# Patient Record
Sex: Male | Born: 1940
Health system: Southern US, Community
[De-identification: ages and names within clinical notes are randomized; demographics above are authoritative.]

## PROBLEM LIST (undated history)

## (undated) DIAGNOSIS — Z8619 Personal history of other infectious and parasitic diseases: Secondary | ICD-10-CM

## (undated) DIAGNOSIS — H409 Unspecified glaucoma: Secondary | ICD-10-CM

## (undated) DIAGNOSIS — N529 Male erectile dysfunction, unspecified: Secondary | ICD-10-CM

## (undated) DIAGNOSIS — R011 Cardiac murmur, unspecified: Secondary | ICD-10-CM

## (undated) DIAGNOSIS — E785 Hyperlipidemia, unspecified: Secondary | ICD-10-CM

## (undated) DIAGNOSIS — Z972 Presence of dental prosthetic device (complete) (partial): Secondary | ICD-10-CM

## (undated) DIAGNOSIS — B0229 Other postherpetic nervous system involvement: Secondary | ICD-10-CM

## (undated) DIAGNOSIS — G43909 Migraine, unspecified, not intractable, without status migrainosus: Secondary | ICD-10-CM

## (undated) HISTORY — DX: Male erectile dysfunction, unspecified: N52.9

## (undated) HISTORY — DX: Cardiac murmur, unspecified: R01.1

## (undated) HISTORY — DX: Personal history of other infectious and parasitic diseases: Z86.19

## (undated) HISTORY — DX: Other postherpetic nervous system involvement: B02.29

## (undated) HISTORY — DX: Hyperlipidemia, unspecified: E78.5

## (undated) HISTORY — PX: HERNIA REPAIR: SHX51

## (undated) HISTORY — DX: Migraine, unspecified, not intractable, without status migrainosus: G43.909

## (undated) HISTORY — DX: Unspecified glaucoma: H40.9

---

## 2010-06-28 ENCOUNTER — Ambulatory Visit: Payer: Self-pay | Admitting: Ophthalmology

## 2011-05-30 HISTORY — PX: COLONOSCOPY: SHX174

## 2013-04-18 ENCOUNTER — Ambulatory Visit: Payer: Self-pay | Admitting: Gastroenterology

## 2013-12-18 DIAGNOSIS — R51 Headache: Secondary | ICD-10-CM

## 2013-12-18 DIAGNOSIS — R519 Headache, unspecified: Secondary | ICD-10-CM | POA: Insufficient documentation

## 2014-05-29 HISTORY — PX: CYSTOSCOPY: SUR368

## 2015-02-10 ENCOUNTER — Ambulatory Visit: Payer: Self-pay

## 2015-02-11 ENCOUNTER — Encounter: Payer: Self-pay | Admitting: Urology

## 2015-02-11 ENCOUNTER — Ambulatory Visit (INDEPENDENT_AMBULATORY_CARE_PROVIDER_SITE_OTHER): Payer: Commercial Managed Care - HMO | Admitting: Urology

## 2015-02-11 VITALS — BP 153/76 | HR 59 | Ht 68.0 in | Wt 165.3 lb

## 2015-02-11 DIAGNOSIS — Z139 Encounter for screening, unspecified: Secondary | ICD-10-CM | POA: Diagnosis not present

## 2015-02-11 MED ORDER — TAMSULOSIN HCL 0.4 MG PO CAPS: 0.4000 mg | ORAL_CAPSULE | Freq: Every day | ORAL | Status: DC

## 2015-02-11 MED ORDER — DUTASTERIDE-TAMSULOSIN HCL 0.5-0.4 MG PO CAPS: 1.0000 | ORAL_CAPSULE | Freq: Every day | ORAL | Status: DC

## 2015-02-11 NOTE — Progress Notes (Signed)
02/11/2015 2:58 PM   Jayshaun B Lingelbach [birth year] [id number]  Referring provider: Maryland Pink, MD [address] [place], [place] [address]  Chief Complaint  Patient presents with  . Establish Care    referred by Dr. Mosie Epstein    HPI: Poor stream hesitancy with known BPH. Previous history of cystoscopy but no surgery in the prostate ever. The cystoscopy was many years ago. Been on finasteride for several years with no change in his urinary habits. He's never been on tamsulosin. C plan further discussion of this problem   PMH: Past Medical History  Diagnosis Date  . Postherpetic neuralgia   . ED (erectile dysfunction)   . Migraine   . HLD (hyperlipidemia)   . Glaucoma   . Heart murmur   . History of shingles     Surgical History: Past Surgical History  Procedure Laterality Date  . Colonoscopy    . Hernia repair      Home Medications:    Medication List       This list is accurate as of: 02/11/15  2:58 PM.  Always use your most recent med list.               aspirin 81 MG tablet  Take 81 mg by mouth 2 (two) times daily.     brimonidine 0.2 % ophthalmic solution  Commonly known as:  ALPHAGAN  Place 1 drop into both eyes 2 (two) times daily.     butalbital-acetaminophen-caffeine 50-325-40 MG per tablet  Commonly known as:  FIORICET, ESGIC  Take 1 tablet by mouth 2 (two) times daily as needed for headache.     dorzolamide-timolol 22.3-6.8 MG/ML ophthalmic solution  Commonly known as:  COSOPT  Place 1 drop into both eyes 2 (two) times daily.     finasteride 5 MG tablet  Commonly known as:  PROSCAR  Take 5 mg by mouth daily.     latanoprost 0.005 % ophthalmic solution  Commonly known as:  XALATAN     simvastatin 20 MG tablet  Commonly known as:  ZOCOR  Take 20 mg by mouth daily.        Allergies:  Allergies  Allergen Reactions  . Advicor [Niacin-Lovastatin Er]   . Penicillins     Family History: Family History  Problem Relation Age of Onset    . Kidney disease Neg Hx   . Prostate cancer Father   . Heart disease Mother     Social History:  reports that he has never smoked. He does not have any smokeless tobacco history on file. He reports that he does not drink alcohol or use illicit drugs.  ROS: UROLOGY Frequent Urination?: Yes Hard to postpone urination?: No Burning/pain with urination?: No Get up at night to urinate?: Yes Leakage of urine?: Yes Urine stream starts and stops?: Yes Trouble starting stream?: No Do you have to strain to urinate?: No Blood in urine?: No Urinary tract infection?: No Sexually transmitted disease?: No Injury to kidneys or bladder?: No Painful intercourse?: No Weak stream?: No Erection problems?: Yes Penile pain?: No  Gastrointestinal Nausea?: No Vomiting?: No Indigestion/heartburn?: No Diarrhea?: No Constipation?: No  Constitutional Fever: No Night sweats?: No Weight loss?: No Fatigue?: No  Skin Skin rash/lesions?: No Itching?: No  Eyes Blurred vision?: No Double vision?: No  Ears/Nose/Throat Sore throat?: No Sinus problems?: No  Hematologic/Lymphatic Swollen glands?: No Easy bruising?: No  Cardiovascular Leg swelling?: No Chest pain?: No  Respiratory Cough?: No Shortness of breath?: No  Endocrine  Excessive thirst?: No  Musculoskeletal Back pain?: No Joint pain?: No  Neurological Headaches?: Yes Dizziness?: No  Psychologic Depression?: No Anxiety?: No  Physical Exam: BP 153/76 mmHg  Pulse 59  Ht 5\' 8"  (1.727 m)  Wt 165 lb 4.8 oz (74.98 kg)  BMI 25.14 kg/m2  Constitutional:  Alert and oriented, No acute distress. HEENT: [place] AT, moist mucus membranes.  Trachea midline, no masses. Cardiovascular: No clubbing, cyanosis, or edema. Respiratory: Normal respiratory effort, no increased work of breathing. GI: Abdomen is soft, nontender, nondistended, no abdominal masses GU: No CVA tenderness. Small prostate normal rectal sphincter testes descended  and normal noncircumcised no masses or lesions on the penis Skin: No rashes, bruises or suspicious lesions. Lymph: No cervical or inguinal adenopathy. Neurologic: Grossly intact, no focal deficits, moving all 4 extremities. Psychiatric: Normal mood and affect.  Laboratory Data: No results found for: WBC, HGB, HCT, MCV, PLT  No results found for: CREATININE  No results found for: PSA  No results found for: TESTOSTERONE  No results found for: HGBA1C  Urinalysis No results found for: COLORURINE, APPEARANCEUR, LABSPEC, PHURINE, GLUCOSEU, HGBUR, BILIRUBINUR, KETONESUR, PROTEINUR, UROBILINOGEN, NITRITE, LEUKOCYTESUR  Pertinent Imaging: None  Assessment & Plan:  Hesitancy of urination and only on finasteride. Unknown result of cystoscopy done several years ago by Dr. Eliberto Ivory. Patient only on finasteride and was told he had a slightly enlarged prostate at that time. Plan cystoscopy in the future and lysed the patient on tamsulosin follow-up in a month  1. Screening yearly PSA drawn to day  - PSA   No Follow-up on file.  Collier Flowers, [facility] [address] [place], [place] [address] [phone]

## 2015-02-12 ENCOUNTER — Ambulatory Visit: Payer: Self-pay

## 2015-02-12 ENCOUNTER — Telehealth: Payer: Self-pay

## 2015-02-12 DIAGNOSIS — N400 Enlarged prostate without lower urinary tract symptoms: Secondary | ICD-10-CM

## 2015-02-12 LAB — MICROSCOPIC EXAMINATION
Bacteria, UA: NONE SEEN
EPITHELIAL CELLS (NON RENAL): NONE SEEN /HPF (ref 0–10)
WBC, UA: NONE SEEN /hpf (ref 0–?)

## 2015-02-12 LAB — URINALYSIS, COMPLETE
BILIRUBIN UA: NEGATIVE
Glucose, UA: NEGATIVE
KETONES UA: NEGATIVE
LEUKOCYTES UA: NEGATIVE
NITRITE UA: NEGATIVE
Protein, UA: NEGATIVE
RBC UA: NEGATIVE
SPEC GRAV UA: 1.015 (ref 1.005–1.030)
Urobilinogen, Ur: 2 mg/dL — ABNORMAL HIGH (ref 0.2–1.0)
pH, UA: 7 (ref 5.0–7.5)

## 2015-02-12 LAB — PSA: Prostate Specific Ag, Serum: 0.8 ng/mL (ref 0.0–4.0)

## 2015-02-12 MED ORDER — TAMSULOSIN HCL 0.4 MG PO CAPS: 0.4000 mg | ORAL_CAPSULE | Freq: Every day | ORAL | Status: DC

## 2015-02-12 MED ORDER — FINASTERIDE 5 MG PO TABS: 5.0000 mg | ORAL_TABLET | Freq: Every day | ORAL | Status: DC

## 2015-02-12 NOTE — Telephone Encounter (Signed)
Spoke with pt in reference to PSA results. Pt requested tamsulosin and finasteride be sent into pt pharmacy. Per Dr. Elnoria Howard medications were sent. Pt made aware. U/a orders put in also.

## 2015-02-12 NOTE — Telephone Encounter (Signed)
-----   Message from Nori Riis, PA-C sent at 02/12/2015  8:37 AM EDT ----- Dr. Elnoria Howard saw this patient.  I do not know how I received his results and I don't know how to forward them to him.  Please let him know of the results.

## 2015-03-11 ENCOUNTER — Ambulatory Visit (INDEPENDENT_AMBULATORY_CARE_PROVIDER_SITE_OTHER): Payer: Commercial Managed Care - HMO | Admitting: Urology

## 2015-03-11 ENCOUNTER — Encounter: Payer: Self-pay | Admitting: Urology

## 2015-03-11 VITALS — BP 174/101 | HR 71 | Ht 67.0 in | Wt 162.6 lb

## 2015-03-11 DIAGNOSIS — N400 Enlarged prostate without lower urinary tract symptoms: Secondary | ICD-10-CM

## 2015-03-11 LAB — MICROSCOPIC EXAMINATION
Bacteria, UA: NONE SEEN
EPITHELIAL CELLS (NON RENAL): NONE SEEN /HPF (ref 0–10)
RBC MICROSCOPIC, UA: NONE SEEN /HPF (ref 0–?)
Renal Epithel, UA: NONE SEEN /hpf
WBC UA: NONE SEEN /HPF (ref 0–?)

## 2015-03-11 LAB — BLADDER SCAN AMB NON-IMAGING

## 2015-03-11 LAB — URINALYSIS, COMPLETE
Appearance Ur: NEGATIVE
BILIRUBIN UA: NEGATIVE
Color, UA: NEGATIVE
Glucose, UA: NEGATIVE
Ketones, UA: NEGATIVE
LEUKOCYTES UA: NEGATIVE
NITRITE UA: NEGATIVE
PH UA: 5.5 (ref 5.0–7.5)
Protein, UA: NEGATIVE
RBC UA: NEGATIVE
Specific Gravity, UA: 1.015 (ref 1.005–1.030)
Urobilinogen, Ur: 1 mg/dL (ref 0.2–1.0)

## 2015-03-11 MED ORDER — CIPROFLOXACIN HCL 500 MG PO TABS
500.0000 mg | ORAL_TABLET | Freq: Once | ORAL | Status: AC
  Administered 2015-03-11: 500 mg via ORAL

## 2015-03-11 MED ORDER — LIDOCAINE HCL 2 % EX GEL
1.0000 "application " | Freq: Once | CUTANEOUS | Status: AC
  Administered 2015-03-11: 1 via URETHRAL

## 2015-03-11 NOTE — Progress Notes (Signed)
03/11/2015 3:17 PM   Adrian Mitchell Jun 11, 1940 [id number]  Referring provider: Maryland Pink, MD [address] [facility] [place], [place] [address]  Chief Complaint  Patient presents with  . Cysto    HPI: The patient returns for cystoscopy. He was on finasteride only until seen by Dr. Elnoria Howard a few weeks ago. He was taking his Flomax as prescribe by Dr. Elnoria Howard for a few weeks, however he stopped it due to dizziness. He was taking medication in the early evening. It did help his urinary symptoms.   PMH: Past Medical History  Diagnosis Date  . Postherpetic neuralgia   . ED (erectile dysfunction)   . Migraine   . HLD (hyperlipidemia)   . Glaucoma   . Heart murmur   . History of shingles     Surgical History: Past Surgical History  Procedure Laterality Date  . Colonoscopy    . Hernia repair      Home Medications:    Medication List       This list is accurate as of: 03/11/15  3:17 PM.  Always use your most recent med list.               aspirin 81 MG tablet  Take 81 mg by mouth 2 (two) times daily.     brimonidine 0.2 % ophthalmic solution  Commonly known as:  ALPHAGAN  Place 1 drop into both eyes 2 (two) times daily.     butalbital-acetaminophen-caffeine 50-325-40 MG tablet  Commonly known as:  FIORICET, ESGIC  Take 1 tablet by mouth 2 (two) times daily as needed for headache.     dorzolamide-timolol 22.3-6.8 MG/ML ophthalmic solution  Commonly known as:  COSOPT  Place 1 drop into both eyes 2 (two) times daily.     finasteride 5 MG tablet  Commonly known as:  PROSCAR  Take 1 tablet (5 mg total) by mouth daily.     latanoprost 0.005 % ophthalmic solution  Commonly known as:  XALATAN     simvastatin 20 MG tablet  Commonly known as:  ZOCOR  Take 20 mg by mouth daily.     tamsulosin 0.4 MG Caps capsule  Commonly known as:  FLOMAX  Take 1 capsule (0.4 mg total) by mouth daily.        Allergies:  Allergies  Allergen Reactions  .  Advicor [Niacin-Lovastatin Er]   . Penicillins     Family History: Family History  Problem Relation Age of Onset  . Kidney disease Neg Hx   . Prostate cancer Father   . Heart disease Mother     Social History:  reports that he has never smoked. He does not have any smokeless tobacco history on file. He reports that he does not drink alcohol or use illicit drugs.  ROS:                                        Physical Exam: BP 174/101 mmHg  Pulse 71  Ht 5\' 7"  (1.702 m)  Wt 162 lb 9.6 oz (73.755 kg)  BMI 25.46 kg/m2  Constitutional:  Alert and oriented, No acute distress. HEENT: [place] AT, moist mucus membranes.  Trachea midline, no masses. Cardiovascular: No clubbing, cyanosis, or edema. Respiratory: Normal respiratory effort, no increased work of breathing. GI: Abdomen is soft, nontender, nondistended, no abdominal masses GU: No CVA tenderness. Normal phallus. Testicles and equal  bilaterally. No masses. Skin: No rashes, bruises or suspicious lesions. Lymph: No cervical or inguinal adenopathy. Neurologic: Grossly intact, no focal deficits, moving all 4 extremities. Psychiatric: Normal mood and affect.  Laboratory Data: No results found for: WBC, HGB, HCT, MCV, PLT  No results found for: CREATININE  Lab Results  Component Value Date   PSA 0.8 02/11/2015    No results found for: TESTOSTERONE  No results found for: HGBA1C  Urinalysis    Component Value Date/Time   GLUCOSEU Negative 02/12/2015 0948   BILIRUBINUR Negative 02/12/2015 0948   NITRITE Negative 02/12/2015 0948   LEUKOCYTESUR Negative 02/12/2015 0948     Cystoscopy Procedure Note  Patient identification was confirmed, informed consent was obtained, and patient was prepped using Betadine solution.  Lidocaine jelly was administered per urethral meatus.    Preoperative abx where received prior to procedure.     Pre-Procedure: - Inspection reveals a normal caliber ureteral  meatus.  Procedure: The flexible cystoscope was introduced without difficulty - No urethral strictures/lesions are present. - Normal prostate - visually nonobstructive.  3 cm in length - Normal bladder neck - Bilateral ureteral orifices identified - Bladder mucosa  reveals no ulcers, tumors, or lesions - No bladder stones - No trabeculation  Retroflexion shows normal bladder neck   Post-Procedure: - Patient tolerated the procedure well  Uroflow: Peak flow:12 ml/s Avg flow: 9 ml/s PVR: 28 cc  Assessment & Plan:    1. BPH (benign prostatic hyperplasia) I discussed with the patient that he should resume his Flomax and take it immediately preceding going to bed due to the fact that it was making him dizzy. He is agreeable to this plan. We will see how he does on dual therapy and have him follow-up in 3 months for a high PSS, uroflow, and PVR. - Urinalysis, Complete - ciprofloxacin (CIPRO) tablet 500 mg; Take 1 tablet (500 mg total) by mouth once. - lidocaine (XYLOCAINE) 2 % jelly 1 application; Place 1 application into the urethra once.   Return in about 3 months (around 06/11/2015) for ipss, uroflow,pvr.  Nickie Retort, MD  [facility] [address] [place], [place] [address] [phone]

## 2015-03-11 NOTE — Progress Notes (Signed)
Uroflow  Peak Flow: 12.20ml Average Flow: 9.23ml Voided Volume: 26ml Voiding Time: 25sec Flow Time: 24.7sec Time to Peak Flow: 6.8sec  PVR Volume: 69ml

## 2015-03-12 ENCOUNTER — Telehealth: Payer: Self-pay | Admitting: Radiology

## 2015-03-12 NOTE — Telephone Encounter (Signed)
Pt states he is unable to take tamsulosin d/t side effects whether it is taken in the am or pm. Please advise.

## 2015-03-12 NOTE — Telephone Encounter (Signed)
This is Dr. Carlynn Purl patient please ask him about it when he returns next week and notify patient of his recommendations thanks

## 2015-03-15 NOTE — Telephone Encounter (Signed)
Can we start him on Rapaflo 8 mg? Let him know that he may have the same side effects with this medication but it is worth a try

## 2015-03-15 NOTE — Telephone Encounter (Signed)
Please advise 

## 2015-03-15 NOTE — Telephone Encounter (Signed)
Spoke with pt in reference to tamsulosin. Put samples of rapaflo at front. Pt voiced understanding.

## 2015-04-05 ENCOUNTER — Telehealth: Payer: Self-pay | Admitting: Urology

## 2015-04-05 ENCOUNTER — Ambulatory Visit (INDEPENDENT_AMBULATORY_CARE_PROVIDER_SITE_OTHER): Payer: Commercial Managed Care - HMO | Admitting: General Surgery

## 2015-04-05 ENCOUNTER — Encounter: Payer: Self-pay | Admitting: General Surgery

## 2015-04-05 VITALS — BP 136/42 | HR 72 | Resp 16 | Ht 67.0 in | Wt 168.0 lb

## 2015-04-05 DIAGNOSIS — D170 Benign lipomatous neoplasm of skin and subcutaneous tissue of head, face and neck: Secondary | ICD-10-CM

## 2015-04-05 DIAGNOSIS — N400 Enlarged prostate without lower urinary tract symptoms: Secondary | ICD-10-CM

## 2015-04-05 DIAGNOSIS — N529 Male erectile dysfunction, unspecified: Secondary | ICD-10-CM | POA: Insufficient documentation

## 2015-04-05 DIAGNOSIS — E785 Hyperlipidemia, unspecified: Secondary | ICD-10-CM | POA: Insufficient documentation

## 2015-04-05 MED ORDER — SILODOSIN 8 MG PO CAPS: 8.0000 mg | ORAL_CAPSULE | Freq: Every day | ORAL | Status: DC

## 2015-04-05 NOTE — Telephone Encounter (Signed)
Medication sent to pt pharmacy 

## 2015-04-05 NOTE — Progress Notes (Signed)
Patient ID: Adrian Mitchell, male   DOB: 1940-10-29, 74 y.o.   MRN: [id number]  Chief Complaint  Patient presents with  . Lipoma    HPI Adrian Mitchell is a 74 y.o. male here today for an evaluation of a lipoma located on his forehead. He states the lipoma has been present for about 35 years and it has minimal change in size since first noticed.  It itches occasionally.   He states he does bleed easily but has not had any documented coagulation problems. I have reviewed the history of present illness with the patient.  HPI  Past Medical History  Diagnosis Date  . Postherpetic neuralgia   . ED (erectile dysfunction)   . Migraine   . HLD (hyperlipidemia)   . Glaucoma   . Heart murmur   . History of shingles     Past Surgical History  Procedure Laterality Date  . Colonoscopy  2013  . Cystoscopy  2016  . Hernia repair      73yrs ago    Family History  Problem Relation Age of Onset  . Prostate cancer Father   . Heart disease Mother     Social History Social History  Substance Use Topics  . Smoking status: Never Smoker   . Smokeless tobacco: Never Used  . Alcohol Use: No    Allergies  Allergen Reactions  . Advicor [Niacin-Lovastatin Er]   . Penicillins     Current Outpatient Prescriptions  Medication Sig Dispense Refill  . aspirin 81 MG tablet Take 81 mg by mouth 2 (two) times daily.     . brimonidine (ALPHAGAN) 0.2 % ophthalmic solution Place 1 drop into both eyes 2 (two) times daily.    . butalbital-acetaminophen-caffeine (FIORICET, ESGIC) 50-325-40 MG per tablet Take 1 tablet by mouth 2 (two) times daily as needed for headache.    . dorzolamide-timolol (COSOPT) 22.3-6.8 MG/ML ophthalmic solution Place 1 drop into both eyes 2 (two) times daily.    . finasteride (PROSCAR) 5 MG tablet Take 1 tablet (5 mg total) by mouth daily. 30 tablet 5  . latanoprost (XALATAN) 0.005 % ophthalmic solution     . silodosin (RAPAFLO) 8 MG CAPS capsule Take 8 mg by mouth daily with  breakfast.    . simvastatin (ZOCOR) 20 MG tablet Take 20 mg by mouth daily.    . tamsulosin (FLOMAX) 0.4 MG CAPS capsule Take 1 capsule (0.4 mg total) by mouth daily. 30 capsule 5   No current facility-administered medications for this visit.    Review of Systems Review of Systems  Constitutional: Negative.   Respiratory: Negative.   Cardiovascular: Negative.     Blood pressure 136/42, pulse 72, resp. rate 16, height 5\' 7"  (1.702 m), weight 168 lb (76.204 kg).  Physical Exam Physical Exam  Constitutional: He is oriented to person, place, and time. He appears well-developed and well-nourished.  HENT:  Head:    Eyes: Conjunctivae are normal. No scleral icterus.  Neck: Neck supple.  Neurological: He is alert and oriented to person, place, and time.  Skin: Skin is warm and dry.    Data Reviewed   Assessment    Lipoma of forehead, basically unchanged over the 35 years it has been present. Not causing any pain or significant issues, only some mild itching. Discussed options for management. Recommended no intervention at this time, patient agreeable.     Plan    Reassurance given, patient agreeable to watch area for now. Informed patient to return if  noticing a more rapid increase in size or any other changes/symptoms to the area.       PCP: Maryland Pink, PCP   Junie Panning G 04/05/2015, 1:18 PM

## 2015-04-05 NOTE — Telephone Encounter (Signed)
Pt stopped in to the office, is requesting a Rx of Rapaflo 8 mg called in to [facility].  Pt states he took a 30 day sample of it and it is working for him.

## 2015-04-05 NOTE — Patient Instructions (Signed)
Follow up if any changes occur

## 2015-04-13 ENCOUNTER — Telehealth: Payer: Self-pay | Admitting: General Surgery

## 2015-04-13 NOTE — Telephone Encounter (Signed)
Schedule excision in office-62mins. mel

## 2015-04-13 NOTE — Telephone Encounter (Signed)
PT CALLED & HE IS HAVING INCREASED HEADACHES WITH THE LIPOMA ON HIS FOREHEAD.HE WOULD LIKE IT REMOVED.CAN WE MAKE THIS APPT? WILL YOU REMOVE IT IN OFC OR OR.?F IN OFC HOW LONG WILL YOU NEED? PLEASE CALL PT TO [facility] @ [phone].

## 2015-04-15 NOTE — Telephone Encounter (Signed)
04-15-15 @ 2:41 L/M FOR PT TO CALL & [facility] AN APPT/MTH

## 2015-04-20 ENCOUNTER — Ambulatory Visit (INDEPENDENT_AMBULATORY_CARE_PROVIDER_SITE_OTHER): Payer: Commercial Managed Care - HMO | Admitting: General Surgery

## 2015-04-20 ENCOUNTER — Encounter: Payer: Self-pay | Admitting: General Surgery

## 2015-04-20 VITALS — BP 148/68 | HR 64 | Resp 12 | Ht 67.0 in | Wt 168.0 lb

## 2015-04-20 DIAGNOSIS — D170 Benign lipomatous neoplasm of skin and subcutaneous tissue of head, face and neck: Secondary | ICD-10-CM

## 2015-04-20 NOTE — Patient Instructions (Signed)
Keep area clean May leave dressing on for 2-3 days

## 2015-04-20 NOTE — Progress Notes (Signed)
Patient ID: DERALD TORTI, male   DOB: Jan 23, 1941, 74 y.o.   MRN: [id number]  Chief Complaint  Patient presents with  . Procedure    excision forhead lipoma    HPI Adrian Mitchell is a 74 y.o. male here today for excision forehead lipoma. Pt states the area has been giving him a burning discomfort and decided to have it removed. I have reviewed the history of present illness with the patient. HPI  Past Medical History  Diagnosis Date  . Postherpetic neuralgia   . ED (erectile dysfunction)   . Migraine   . HLD (hyperlipidemia)   . Glaucoma   . Heart murmur   . History of shingles     Past Surgical History  Procedure Laterality Date  . Colonoscopy  2013  . Cystoscopy  2016  . Hernia repair      74yrs ago    Family History  Problem Relation Age of Onset  . Prostate cancer Father   . Heart disease Mother     Social History Social History  Substance Use Topics  . Smoking status: Never Smoker   . Smokeless tobacco: Never Used  . Alcohol Use: No    Allergies  Allergen Reactions  . Advicor [Niacin-Lovastatin Er]   . Penicillins     Current Outpatient Prescriptions  Medication Sig Dispense Refill  . aspirin 81 MG tablet Take 81 mg by mouth 2 (two) times daily.     . brimonidine (ALPHAGAN) 0.2 % ophthalmic solution Place 1 drop into both eyes 2 (two) times daily.    . butalbital-acetaminophen-caffeine (FIORICET, ESGIC) 50-325-40 MG per tablet Take 1 tablet by mouth 2 (two) times daily as needed for headache.    . dorzolamide-timolol (COSOPT) 22.3-6.8 MG/ML ophthalmic solution Place 1 drop into both eyes 2 (two) times daily.    . finasteride (PROSCAR) 5 MG tablet Take 1 tablet (5 mg total) by mouth daily. 30 tablet 5  . latanoprost (XALATAN) 0.005 % ophthalmic solution     . silodosin (RAPAFLO) 8 MG CAPS capsule Take 1 capsule (8 mg total) by mouth daily with breakfast. 30 capsule 3  . simvastatin (ZOCOR) 20 MG tablet Take 20 mg by mouth daily.    . tamsulosin  (FLOMAX) 0.4 MG CAPS capsule Take 1 capsule (0.4 mg total) by mouth daily. 30 capsule 5   No current facility-administered medications for this visit.    Review of Systems Review of Systems  Constitutional: Negative.   Respiratory: Negative.   Cardiovascular: Negative.     Blood pressure 148/68, pulse 64, resp. rate 12, height 5\' 7"  (1.702 m), weight 168 lb (76.204 kg).  Physical Exam Physical Exam 2 cm lipoma forehead-unchanged from recent visit Data Reviewed  Notes reviewed Assessment    Lipoma forehead    Plan    Procedure: Excision lipoma forehead  Anesthetic: 1% xylocaine mixed with 0.5% marcaine, 36ml  Prep: Chloro Prep  Description. After local and prep area was draped out. Transverse 2cm skin incision made along skin crease. The lipoma was beneath the muscle but was mildly lobulated and circumscribed. It was removed and sent to pathology. Deep tissue closed with 3-0 Vicryl. Skin closed with 5-0 Prolene.  Dressing: neosporin, telfa and tegaderm. No immediate problems from procedure.   Advised on wound care.  Suture removal in 1 week.  PCP:  Buzzy Han 04/20/2015, 5:37 PM

## 2015-04-27 ENCOUNTER — Ambulatory Visit (INDEPENDENT_AMBULATORY_CARE_PROVIDER_SITE_OTHER): Payer: Commercial Managed Care - HMO | Admitting: *Deleted

## 2015-04-27 DIAGNOSIS — D170 Benign lipomatous neoplasm of skin and subcutaneous tissue of head, face and neck: Secondary | ICD-10-CM

## 2015-04-27 NOTE — Progress Notes (Signed)
Patient came in today for a wound check.  The wound is clean, with no signs of infection noted. The sutures were removed and steri strips applied.  

## 2015-04-28 ENCOUNTER — Telehealth: Payer: Self-pay | Admitting: *Deleted

## 2015-04-28 NOTE — Telephone Encounter (Signed)
-----   Message from Christene Lye, MD sent at 04/28/2015  8:40 AM EST ----- Marsha,please let pt pt know the pathology was normal.

## 2015-04-28 NOTE — Telephone Encounter (Signed)
Notified patient as instructed, patient pleased. Discussed follow-up appointments, patient agrees  

## 2015-06-03 DIAGNOSIS — H401132 Primary open-angle glaucoma, bilateral, moderate stage: Secondary | ICD-10-CM | POA: Diagnosis not present

## 2015-06-10 ENCOUNTER — Ambulatory Visit (INDEPENDENT_AMBULATORY_CARE_PROVIDER_SITE_OTHER): Payer: PPO | Admitting: Urology

## 2015-06-10 ENCOUNTER — Encounter: Payer: Self-pay | Admitting: Urology

## 2015-06-10 VITALS — BP 154/94 | HR 82 | Ht 67.0 in | Wt 167.4 lb

## 2015-06-10 DIAGNOSIS — N400 Enlarged prostate without lower urinary tract symptoms: Secondary | ICD-10-CM | POA: Diagnosis not present

## 2015-06-10 DIAGNOSIS — B0229 Other postherpetic nervous system involvement: Secondary | ICD-10-CM | POA: Insufficient documentation

## 2015-06-10 DIAGNOSIS — G43909 Migraine, unspecified, not intractable, without status migrainosus: Secondary | ICD-10-CM | POA: Insufficient documentation

## 2015-06-10 LAB — URINALYSIS, COMPLETE
Bilirubin, UA: NEGATIVE
Glucose, UA: NEGATIVE
Ketones, UA: NEGATIVE
Leukocytes, UA: NEGATIVE
NITRITE UA: NEGATIVE
Protein, UA: NEGATIVE
RBC, UA: NEGATIVE
Specific Gravity, UA: 1.025 (ref 1.005–1.030)
Urobilinogen, Ur: 1 mg/dL (ref 0.2–1.0)
pH, UA: 5 (ref 5.0–7.5)

## 2015-06-10 LAB — BLADDER SCAN AMB NON-IMAGING

## 2015-06-10 LAB — MICROSCOPIC EXAMINATION: EPITHELIAL CELLS (NON RENAL): NONE SEEN /HPF (ref 0–10)

## 2015-06-10 MED ORDER — FINASTERIDE 5 MG PO TABS: 5.0000 mg | ORAL_TABLET | Freq: Every day | ORAL | Status: DC

## 2015-06-10 NOTE — Progress Notes (Signed)
06/10/2015 2:15 PM   Adrian Mitchell [birth year] [id number]  Referring provider: Maryland Pink, MD [address] [facility] [place], [place] [address]  Chief Complaint  Patient presents with  . Benign Prostatic Hypertrophy    31months    HPI:  The patient returns for cystoscopy. He was on finasteride only until seen by Dr. Elnoria Howard a few weeks ago. Per Dr. Guinevere Ferrari exam he had a small prostate. He was taking his Flomax as prescribe by Dr. Elnoria Howard for a few weeks, however he stopped it due to dizziness. He was taking medication in the early evening. It did help his urinary symptoms.  Cysto: visually nonobstructive prostate.  3 cm in length  Uroflow: Peak flow:12 ml/s Avg flow: 9 ml/s PVR: 28 cc   January 2017 Interval History: The patient returns today after 3 month trial of finasteride and Flomax. At his last visit he was instructed to take Flomax before bed as he was having dizziness in the evening when he took it with dinner. He was still unable to tolerate this medication. However he reports that he is very happy with his urinary symptoms now they've improved since his last visit. He rates his urinary quality of life is mostly satisfied. He does note frequency and intermittency almost always but this does not bother him. He is not interested in trying a second medication to complement the finasteride such as Rapaflo.  IPSS: 18/2 PVR: 0 cc   PMH: Past Medical History  Diagnosis Date  . Postherpetic neuralgia   . ED (erectile dysfunction)   . Migraine   . HLD (hyperlipidemia)   . Glaucoma   . Heart murmur   . History of shingles     Surgical History: Past Surgical History  Procedure Laterality Date  . Colonoscopy  2013  . Cystoscopy  2016  . Hernia repair      34yrs ago    Home Medications:    Medication List       This list is accurate as of: 06/10/15  2:15 PM.  Always use your most recent med list.               aspirin 81 MG tablet  Take 81 mg by  mouth 2 (two) times daily.     brimonidine 0.2 % ophthalmic solution  Commonly known as:  ALPHAGAN  Place 1 drop into both eyes 2 (two) times daily.     butalbital-acetaminophen-caffeine 50-325-40 MG tablet  Commonly known as:  FIORICET, ESGIC  Take 1 tablet by mouth 2 (two) times daily as needed for headache.     dorzolamide-timolol 22.3-6.8 MG/ML ophthalmic solution  Commonly known as:  COSOPT  Place 1 drop into both eyes 2 (two) times daily.     finasteride 5 MG tablet  Commonly known as:  PROSCAR  Take 1 tablet (5 mg total) by mouth daily.     finasteride 5 MG tablet  Commonly known as:  PROSCAR  Take 1 tablet (5 mg total) by mouth daily.     latanoprost 0.005 % ophthalmic solution  Commonly known as:  XALATAN     silodosin 8 MG Caps capsule  Commonly known as:  RAPAFLO  Take 1 capsule (8 mg total) by mouth daily with breakfast.     simvastatin 20 MG tablet  Commonly known as:  ZOCOR  Take 20 mg by mouth daily.     tamsulosin 0.4 MG Caps capsule  Commonly known as:  FLOMAX  Take 1  capsule (0.4 mg total) by mouth daily.        Allergies:  Allergies  Allergen Reactions  . Advicor [Niacin-Lovastatin Er]   . Penicillins     Family History: Family History  Problem Relation Age of Onset  . Prostate cancer Father   . Heart disease Mother     Social History:  reports that he has never smoked. He has never used smokeless tobacco. He reports that he does not drink alcohol or use illicit drugs.  ROS: UROLOGY Frequent Urination?: No Hard to postpone urination?: No Burning/pain with urination?: No Get up at night to urinate?: No Leakage of urine?: No Urine stream starts and stops?: No Trouble starting stream?: No Do you have to strain to urinate?: No Blood in urine?: No Urinary tract infection?: No Sexually transmitted disease?: No Injury to kidneys or bladder?: No Painful intercourse?: No Weak stream?: No Erection problems?: No Penile pain?:  No  Gastrointestinal Nausea?: No Vomiting?: No Indigestion/heartburn?: No Diarrhea?: No Constipation?: No  Constitutional Fever: No Night sweats?: No Weight loss?: No Fatigue?: No  Skin Skin rash/lesions?: No Itching?: No  Eyes Blurred vision?: No Double vision?: No  Ears/Nose/Throat Sore throat?: No Sinus problems?: No  Hematologic/Lymphatic Swollen glands?: No Easy bruising?: No  Cardiovascular Leg swelling?: No Chest pain?: No  Respiratory Cough?: No Shortness of breath?: No  Endocrine Excessive thirst?: No  Musculoskeletal Back pain?: No Joint pain?: No  Neurological Headaches?: No Dizziness?: No  Psychologic Depression?: No Anxiety?: No  Physical Exam: BP 154/94 mmHg  Pulse 82  Ht 5\' 7"  (1.702 m)  Wt 167 lb 6.4 oz (75.932 kg)  BMI 26.21 kg/m2  Constitutional:  Alert and oriented, No acute distress. HEENT: [place] AT, moist mucus membranes.  Trachea midline, no masses. Cardiovascular: No clubbing, cyanosis, or edema. Respiratory: Normal respiratory effort, no increased work of breathing. GI: Abdomen is soft, nontender, nondistended, no abdominal masses GU: No CVA tenderness.  Skin: No rashes, bruises or suspicious lesions. Lymph: No cervical or inguinal adenopathy. Neurologic: Grossly intact, no focal deficits, moving all 4 extremities. Psychiatric: Normal mood and affect.  Laboratory Data: No results found for: WBC, HGB, HCT, MCV, PLT  No results found for: CREATININE  No results found for: PSA  No results found for: TESTOSTERONE  No results found for: HGBA1C  Urinalysis    Component Value Date/Time   GLUCOSEU Negative 03/11/2015 1446   BILIRUBINUR Negative 03/11/2015 1446   NITRITE Negative 03/11/2015 1446   LEUKOCYTESUR Negative 03/11/2015 1446     Assessment & Plan:   1. BPH -continue finasteride 5 mg -follow up in 6 months for IPSS, uroflow, pvr   Nickie Retort, MD  [facility] [address] [place], [place] [address] [phone]

## 2015-06-17 ENCOUNTER — Ambulatory Visit: Payer: Commercial Managed Care - HMO

## 2015-06-21 ENCOUNTER — Encounter: Payer: Self-pay | Admitting: Urology

## 2015-08-17 DIAGNOSIS — Z0000 Encounter for general adult medical examination without abnormal findings: Secondary | ICD-10-CM | POA: Diagnosis not present

## 2015-08-17 DIAGNOSIS — E785 Hyperlipidemia, unspecified: Secondary | ICD-10-CM | POA: Diagnosis not present

## 2015-08-20 DIAGNOSIS — H401132 Primary open-angle glaucoma, bilateral, moderate stage: Secondary | ICD-10-CM | POA: Diagnosis not present

## 2015-08-24 DIAGNOSIS — Z0000 Encounter for general adult medical examination without abnormal findings: Secondary | ICD-10-CM | POA: Diagnosis not present

## 2015-08-24 DIAGNOSIS — E538 Deficiency of other specified B group vitamins: Secondary | ICD-10-CM | POA: Diagnosis not present

## 2015-08-24 DIAGNOSIS — R5381 Other malaise: Secondary | ICD-10-CM | POA: Diagnosis not present

## 2015-08-24 DIAGNOSIS — R5383 Other fatigue: Secondary | ICD-10-CM | POA: Diagnosis not present

## 2015-08-24 DIAGNOSIS — G43709 Chronic migraine without aura, not intractable, without status migrainosus: Secondary | ICD-10-CM | POA: Diagnosis not present

## 2015-08-24 DIAGNOSIS — H539 Unspecified visual disturbance: Secondary | ICD-10-CM | POA: Diagnosis not present

## 2015-08-27 DIAGNOSIS — E538 Deficiency of other specified B group vitamins: Secondary | ICD-10-CM | POA: Diagnosis not present

## 2015-08-30 DIAGNOSIS — G44229 Chronic tension-type headache, not intractable: Secondary | ICD-10-CM | POA: Diagnosis not present

## 2015-08-30 DIAGNOSIS — H539 Unspecified visual disturbance: Secondary | ICD-10-CM | POA: Diagnosis not present

## 2015-09-03 DIAGNOSIS — E538 Deficiency of other specified B group vitamins: Secondary | ICD-10-CM | POA: Diagnosis not present

## 2015-09-08 ENCOUNTER — Other Ambulatory Visit: Payer: Self-pay | Admitting: Neurology

## 2015-09-08 DIAGNOSIS — H539 Unspecified visual disturbance: Secondary | ICD-10-CM

## 2015-09-08 DIAGNOSIS — G44221 Chronic tension-type headache, intractable: Secondary | ICD-10-CM

## 2015-09-09 DIAGNOSIS — E538 Deficiency of other specified B group vitamins: Secondary | ICD-10-CM | POA: Diagnosis not present

## 2015-09-17 DIAGNOSIS — E538 Deficiency of other specified B group vitamins: Secondary | ICD-10-CM | POA: Diagnosis not present

## 2015-09-21 DIAGNOSIS — R079 Chest pain, unspecified: Secondary | ICD-10-CM | POA: Diagnosis not present

## 2015-09-21 DIAGNOSIS — R5383 Other fatigue: Secondary | ICD-10-CM | POA: Diagnosis not present

## 2015-09-21 DIAGNOSIS — R5381 Other malaise: Secondary | ICD-10-CM | POA: Diagnosis not present

## 2015-09-21 DIAGNOSIS — R51 Headache: Secondary | ICD-10-CM | POA: Diagnosis not present

## 2015-09-21 DIAGNOSIS — E538 Deficiency of other specified B group vitamins: Secondary | ICD-10-CM | POA: Diagnosis not present

## 2015-09-27 DIAGNOSIS — R079 Chest pain, unspecified: Secondary | ICD-10-CM | POA: Diagnosis not present

## 2015-09-28 DIAGNOSIS — L708 Other acne: Secondary | ICD-10-CM | POA: Diagnosis not present

## 2015-09-30 ENCOUNTER — Ambulatory Visit
Admission: RE | Admit: 2015-09-30 | Discharge: 2015-09-30 | Disposition: A | Discharge status: Home / Self Care | Payer: PPO | Source: Ambulatory Visit | Attending: Neurology | Admitting: Neurology

## 2015-09-30 DIAGNOSIS — R9082 White matter disease, unspecified: Secondary | ICD-10-CM | POA: Insufficient documentation

## 2015-09-30 DIAGNOSIS — G44229 Chronic tension-type headache, not intractable: Secondary | ICD-10-CM | POA: Diagnosis not present

## 2015-09-30 DIAGNOSIS — G930 Cerebral cysts: Secondary | ICD-10-CM | POA: Insufficient documentation

## 2015-09-30 DIAGNOSIS — H539 Unspecified visual disturbance: Secondary | ICD-10-CM | POA: Insufficient documentation

## 2015-09-30 DIAGNOSIS — G311 Senile degeneration of brain, not elsewhere classified: Secondary | ICD-10-CM | POA: Insufficient documentation

## 2015-09-30 DIAGNOSIS — G44221 Chronic tension-type headache, intractable: Secondary | ICD-10-CM

## 2015-09-30 DIAGNOSIS — R51 Headache: Secondary | ICD-10-CM | POA: Diagnosis not present

## 2015-10-16 ENCOUNTER — Emergency Department
Admission: EM | Admit: 2015-10-16 | Discharge: 2015-10-16 | Disposition: A | Discharge status: Home / Self Care | Payer: PPO | Attending: Emergency Medicine | Admitting: Emergency Medicine

## 2015-10-16 ENCOUNTER — Encounter: Payer: Self-pay | Admitting: Emergency Medicine

## 2015-10-16 ENCOUNTER — Emergency Department: Payer: PPO

## 2015-10-16 DIAGNOSIS — Z7982 Long term (current) use of aspirin: Secondary | ICD-10-CM | POA: Diagnosis not present

## 2015-10-16 DIAGNOSIS — E785 Hyperlipidemia, unspecified: Secondary | ICD-10-CM | POA: Diagnosis not present

## 2015-10-16 DIAGNOSIS — K5900 Constipation, unspecified: Secondary | ICD-10-CM | POA: Diagnosis not present

## 2015-10-16 DIAGNOSIS — K5901 Slow transit constipation: Secondary | ICD-10-CM | POA: Insufficient documentation

## 2015-10-16 DIAGNOSIS — Z79899 Other long term (current) drug therapy: Secondary | ICD-10-CM | POA: Insufficient documentation

## 2015-10-16 NOTE — Discharge Instructions (Signed)

## 2015-10-16 NOTE — ED Notes (Signed)
Report given to Lana, RN.

## 2015-10-16 NOTE — ED Notes (Signed)
Pt to ed with c/o no bm x 6 days.

## 2015-10-16 NOTE — ED Provider Notes (Signed)
[facility] Emergency Department Provider Note        Time seen: ----------------------------------------- 10:33 AM on 10/16/2015 -----------------------------------------    I have reviewed the triage vital signs and the nursing notes.   HISTORY  Chief Complaint Constipation    HPI Adrian Mitchell is a 75 y.o. male who presents to ER after not having had a bowel movement in the last 6 days.Patient recently had some medication changes and was not able to take MiraLAX as previously prescribed. Patient presents with abdominal pain at this time. Nothing is made his symptoms better or worse.   Past Medical History  Diagnosis Date  . Postherpetic neuralgia   . ED (erectile dysfunction)   . Migraine   . HLD (hyperlipidemia)   . Glaucoma   . Heart murmur   . History of shingles     Patient Active Problem List   Diagnosis Date Noted  . Headache, migraine 06/10/2015  . HZV (herpes zoster virus) post herpetic neuralgia 06/10/2015  . HLD (hyperlipidemia) 04/05/2015  . ED (erectile dysfunction) of organic origin 04/05/2015  . Cephalalgia 12/18/2013    Past Surgical History  Procedure Laterality Date  . Colonoscopy  2013  . Cystoscopy  2016  . Hernia repair      35yrs ago    Allergies Advicor and Penicillins  Social History Social History  Substance Use Topics  . Smoking status: Never Smoker   . Smokeless tobacco: Never Used  . Alcohol Use: No    Review of Systems Constitutional: Negative for fever. Eyes: Negative for visual changes. ENT: Negative for sore throat. Cardiovascular: Negative for chest pain. Respiratory: Negative for shortness of breath. Gastrointestinal: Positive for abdominal pain and constipation Genitourinary: Negative for dysuria. Musculoskeletal: Negative for back pain. Skin: Negative for rash. Neurological: Negative for headaches, focal weakness or numbness.  10-point ROS otherwise  negative.  ____________________________________________   PHYSICAL EXAM:  VITAL SIGNS: ED Triage Vitals  Enc Vitals Group     BP 10/16/15 1020 166/95 mmHg     Pulse Rate 10/16/15 1020 87     Resp 10/16/15 1020 20     Temp 10/16/15 1020 98.2 F (36.8 C)     Temp Source 10/16/15 1020 Oral     SpO2 10/16/15 1020 99 %     Weight 10/16/15 1020 140 lb (63.504 kg)     Height 10/16/15 1020 5\' 7"  (1.702 m)     Head Cir --      Peak Flow --      Pain Score 10/16/15 1021 10     Pain Loc --      Pain Edu? --      Excl. in [place]? --     Constitutional: Alert and oriented.No acute distress Eyes: Conjunctivae are normal. PERRL. Normal extraocular movements. ENT   Head: Normocephalic and atraumatic.   Nose: No congestion/rhinnorhea.   Mouth/Throat: Mucous membranes are moist.   Neck: No stridor. Cardiovascular: Normal rate, regular rhythm. No murmurs, rubs, or gallops. Respiratory: Normal respiratory effort without tachypnea nor retractions. Breath sounds are clear and equal bilaterally. No wheezes/rales/rhonchi. Gastrointestinal: Soft and nontender. Normal bowel sounds Musculoskeletal: Nontender with normal range of motion in all extremities. No lower extremity tenderness nor edema. Neurologic:  Normal speech and language. No gross focal neurologic deficits are appreciated.  Skin:  Skin is warm, dry and intact. No rash noted. Psychiatric: Mood and affect are normal. Speech and behavior are normal.   ____________________________________________  ED COURSE:  Pertinent labs &  imaging results that were available during my care of the patient were reviewed by me and considered in my medical decision making (see chart for details). At the time my evaluation patient had a very large bowel movement spontaneously in the room. There was hard stool followed by soft or stool. ____________________________________________    RADIOLOGY Images were viewed by  me  KUB IMPRESSION: Unremarkable abdominal radiograph.  ____________________________________________  FINAL ASSESSMENT AND PLAN  Constipation  Plan: Patient with imaging as dictated above. Patient presents to ER after having a spontaneous bowel movement while in the room here. I will advise increased MiraLAX at home as needed. He is stable for discharge.   Earleen Newport, MD   Note: This dictation was prepared with Dragon dictation. Any transcriptional errors that result from this process are unintentional   Earleen Newport, MD 10/16/15 1114

## 2015-10-16 NOTE — ED Notes (Signed)
Upon arrival to room pt has a large stool, with some hard stool at the beginning noted. MD at the bedside and is aware.Marland Kitchen

## 2015-10-21 DIAGNOSIS — R51 Headache: Secondary | ICD-10-CM | POA: Diagnosis not present

## 2015-10-21 DIAGNOSIS — R5381 Other malaise: Secondary | ICD-10-CM | POA: Diagnosis not present

## 2015-10-21 DIAGNOSIS — K5900 Constipation, unspecified: Secondary | ICD-10-CM | POA: Diagnosis not present

## 2015-10-21 DIAGNOSIS — E538 Deficiency of other specified B group vitamins: Secondary | ICD-10-CM | POA: Diagnosis not present

## 2015-10-21 DIAGNOSIS — R5383 Other fatigue: Secondary | ICD-10-CM | POA: Diagnosis not present

## 2015-10-29 DIAGNOSIS — E538 Deficiency of other specified B group vitamins: Secondary | ICD-10-CM | POA: Diagnosis not present

## 2015-11-12 DIAGNOSIS — E538 Deficiency of other specified B group vitamins: Secondary | ICD-10-CM | POA: Diagnosis not present

## 2015-11-26 DIAGNOSIS — R5383 Other fatigue: Secondary | ICD-10-CM | POA: Diagnosis not present

## 2015-11-26 DIAGNOSIS — E538 Deficiency of other specified B group vitamins: Secondary | ICD-10-CM | POA: Diagnosis not present

## 2015-12-09 ENCOUNTER — Ambulatory Visit: Payer: PPO | Admitting: Urology

## 2015-12-10 DIAGNOSIS — E538 Deficiency of other specified B group vitamins: Secondary | ICD-10-CM | POA: Diagnosis not present

## 2015-12-20 DIAGNOSIS — H401132 Primary open-angle glaucoma, bilateral, moderate stage: Secondary | ICD-10-CM | POA: Diagnosis not present

## 2015-12-30 ENCOUNTER — Ambulatory Visit (INDEPENDENT_AMBULATORY_CARE_PROVIDER_SITE_OTHER): Payer: PPO | Admitting: Urology

## 2015-12-30 ENCOUNTER — Encounter: Payer: Self-pay | Admitting: Urology

## 2015-12-30 VITALS — BP 149/80 | HR 66 | Ht 67.0 in | Wt 165.4 lb

## 2015-12-30 DIAGNOSIS — N400 Enlarged prostate without lower urinary tract symptoms: Secondary | ICD-10-CM

## 2015-12-30 NOTE — Progress Notes (Signed)
12/30/2015 2:32 PM   Adrian Mitchell 1941-04-26 [id number]  Referring provider: Maryland Pink, MD [address] [facility] [place], [place] [address]  Chief Complaint  Patient presents with  . Follow-up    BPH    HPI: The patient is a 75 year old gentleman with a past medical history of BPH on finasteride presents for 6 month follow-up. IPSS is 15/2. He notes incomplete emptying, intermittency, and weak stream and the 3 category. He is nocturia 2. But he is overall satisfied with his urinary symptoms. PVR is 13 cc. He has no new complaints at this time.  He has been on Flomax in the past but was unable to tolerate it due to dizziness.     PMH: Past Medical History:  Diagnosis Date  . ED (erectile dysfunction)   . Glaucoma   . Heart murmur   . History of shingles   . HLD (hyperlipidemia)   . Migraine   . Postherpetic neuralgia     Surgical History: Past Surgical History:  Procedure Laterality Date  . COLONOSCOPY  2013  . CYSTOSCOPY  2016  . HERNIA REPAIR     52yrs ago    Home Medications:    Medication List       Accurate as of 12/30/15  2:32 PM. Always use your most recent med list.          aspirin 81 MG tablet Take 81 mg by mouth 2 (two) times daily.   brimonidine 0.2 % ophthalmic solution Commonly known as:  ALPHAGAN Place 1 drop into both eyes 2 (two) times daily.   butalbital-acetaminophen-caffeine 50-325-40 MG tablet Commonly known as:  FIORICET, ESGIC Take 1 tablet by mouth 2 (two) times daily as needed for headache.   dorzolamide-timolol 22.3-6.8 MG/ML ophthalmic solution Commonly known as:  COSOPT Place 1 drop into both eyes 2 (two) times daily.   finasteride 5 MG tablet Commonly known as:  PROSCAR Take 1 tablet (5 mg total) by mouth daily.   finasteride 5 MG tablet Commonly known as:  PROSCAR Take 1 tablet (5 mg total) by mouth daily.   latanoprost 0.005 % ophthalmic solution Commonly known as:  XALATAN   silodosin 8 MG  Caps capsule Commonly known as:  RAPAFLO Take 1 capsule (8 mg total) by mouth daily with breakfast.   simvastatin 20 MG tablet Commonly known as:  ZOCOR Take 20 mg by mouth daily.   tamsulosin 0.4 MG Caps capsule Commonly known as:  FLOMAX Take 1 capsule (0.4 mg total) by mouth daily.       Allergies:  Allergies  Allergen Reactions  . Advicor [Niacin-Lovastatin Er]   . Penicillins     Family History: Family History  Problem Relation Age of Onset  . Prostate cancer Father   . Heart disease Mother     Social History:  reports that he has never smoked. He has never used smokeless tobacco. He reports that he does not drink alcohol or use drugs.  ROS: UROLOGY Frequent Urination?: No Hard to postpone urination?: No Burning/pain with urination?: No Get up at night to urinate?: Yes Leakage of urine?: Yes Urine stream starts and stops?: Yes Trouble starting stream?: No Do you have to strain to urinate?: No Blood in urine?: No Urinary tract infection?: No Sexually transmitted disease?: No Injury to kidneys or bladder?: No Painful intercourse?: No Weak stream?: No Erection problems?: No Penile pain?: No  Gastrointestinal Nausea?: No Vomiting?: No Indigestion/heartburn?: No Diarrhea?: No Constipation?: No  Constitutional  Fever: No Night sweats?: No Weight loss?: No Fatigue?: No  Skin Skin rash/lesions?: No Itching?: No  Eyes Blurred vision?: No Double vision?: No  Ears/Nose/Throat Sore throat?: No Sinus problems?: No  Hematologic/Lymphatic Swollen glands?: No Easy bruising?: No  Cardiovascular Leg swelling?: No Chest pain?: No  Respiratory Cough?: No Shortness of breath?: No  Endocrine Excessive thirst?: No  Musculoskeletal Back pain?: No Joint pain?: No  Neurological Headaches?: No Dizziness?: No  Psychologic Depression?: No Anxiety?: No  Physical Exam: BP (!) 149/80   Pulse 66   Ht 5\' 7"  (1.702 m)   Wt 165 lb 6.4 oz (75  kg)   BMI 25.91 kg/m   Constitutional:  Alert and oriented, No acute distress. HEENT: [place] AT, moist mucus membranes.  Trachea midline, no masses. Cardiovascular: No clubbing, cyanosis, or edema. Respiratory: Normal respiratory effort, no increased work of breathing. GI: Abdomen is soft, nontender, nondistended, no abdominal masses GU: No CVA tenderness.  Skin: No rashes, bruises or suspicious lesions. Lymph: No cervical or inguinal adenopathy. Neurologic: Grossly intact, no focal deficits, moving all 4 extremities. Psychiatric: Normal mood and affect.  Laboratory Data: No results found for: WBC, HGB, HCT, MCV, PLT  No results found for: CREATININE  No results found for: PSA  No results found for: TESTOSTERONE  No results found for: HGBA1C  Urinalysis    Component Value Date/Time   APPEARANCEUR Clear 06/10/2015 1407   GLUCOSEU Negative 06/10/2015 1407   BILIRUBINUR Negative 06/10/2015 1407   PROTEINUR Negative 06/10/2015 1407   NITRITE Negative 06/10/2015 1407   LEUKOCYTESUR Negative 06/10/2015 1407      Assessment & Plan:    1. BPH -continue finasteride -follow up in one year  Return in about 1 year (around 12/29/2016).  Nickie Retort, MD  [facility] [address] [place], [place] [address] [phone]

## 2016-04-18 DIAGNOSIS — H401132 Primary open-angle glaucoma, bilateral, moderate stage: Secondary | ICD-10-CM | POA: Diagnosis not present

## 2016-07-10 ENCOUNTER — Other Ambulatory Visit: Payer: Self-pay

## 2016-07-10 DIAGNOSIS — N401 Enlarged prostate with lower urinary tract symptoms: Secondary | ICD-10-CM

## 2016-07-10 MED ORDER — FINASTERIDE 5 MG PO TABS: 5.0000 mg | ORAL_TABLET | Freq: Every day | ORAL | 5 refills | Status: DC

## 2016-08-04 ENCOUNTER — Encounter: Payer: Self-pay | Admitting: Urology

## 2016-08-04 ENCOUNTER — Ambulatory Visit (INDEPENDENT_AMBULATORY_CARE_PROVIDER_SITE_OTHER): Payer: PPO | Admitting: Urology

## 2016-08-04 VITALS — BP 169/95 | HR 59 | Ht 68.0 in | Wt 164.4 lb

## 2016-08-04 DIAGNOSIS — N400 Enlarged prostate without lower urinary tract symptoms: Secondary | ICD-10-CM

## 2016-08-04 DIAGNOSIS — R350 Frequency of micturition: Secondary | ICD-10-CM | POA: Diagnosis not present

## 2016-08-04 LAB — URINALYSIS, COMPLETE
BILIRUBIN UA: NEGATIVE
Glucose, UA: NEGATIVE
Ketones, UA: NEGATIVE
LEUKOCYTES UA: NEGATIVE
Nitrite, UA: NEGATIVE
PH UA: 5 (ref 5.0–7.5)
PROTEIN UA: NEGATIVE
RBC UA: NEGATIVE
Specific Gravity, UA: <1.005 — ABNORMAL LOW (ref 1.005–1.030)
Urobilinogen, Ur: 0.2 mg/dL (ref 0.2–1.0)

## 2016-08-04 LAB — MICROSCOPIC EXAMINATION
BACTERIA UA: NONE SEEN
Epithelial Cells (non renal): NONE SEEN /hpf (ref 0–10)
RBC, UA: NONE SEEN /hpf (ref 0–?)
WBC, UA: NONE SEEN /hpf (ref 0–?)

## 2016-08-04 NOTE — Progress Notes (Signed)
08/04/2016 3:07 PM   Adrian Mitchell 21-Dec-1940 [id number]  Referring provider: Maryland Pink, MD [address] [facility] [place], [place] [address]  Chief Complaint  Patient presents with  . Follow-up    BPH, urinary frequency    HPI: The patient is a 76 year old gentleman with a past medical history of BPH on finasteride presents for 6 month follow-up. IPSS is 22/3. He notes that his venous complaints are frequency and intermittency. He has nocturia 2. He also notes that occasionally will have the urge to the bathroom 30 minutes after urinating where there is not a lot of volume voided. He does have some feeling of incomplete emptying, urgency, and weak stream. He does right on the IPSS score is quality of life is mixed. Despite the score, on direct questioning he says he is very happy with his symptoms overall and does not mind having this urinary quality of life. He is not interested in trying any further medications as his symptoms he does not believe to be that bad.   He has been on Flomax in the past but was unable to tolerate it due to dizziness.       PMH: Past Medical History:  Diagnosis Date  . ED (erectile dysfunction)   . Glaucoma   . Heart murmur   . History of shingles   . HLD (hyperlipidemia)   . Migraine   . Postherpetic neuralgia     Surgical History: Past Surgical History:  Procedure Laterality Date  . COLONOSCOPY  2013  . CYSTOSCOPY  2016  . HERNIA REPAIR     75yrs ago    Home Medications:  Allergies as of 08/04/2016      Reactions   Advicor [niacin-lovastatin Er]    Penicillins       Medication List       Accurate as of 08/04/16  3:07 PM. Always use your most recent med list.          aspirin 81 MG tablet Take 81 mg by mouth 2 (two) times daily.   brimonidine 0.2 % ophthalmic solution Commonly known as:  ALPHAGAN Place 1 drop into both eyes 2 (two) times daily.   butalbital-acetaminophen-caffeine 50-325-40 MG  tablet Commonly known as:  FIORICET, ESGIC Take 1 tablet by mouth 2 (two) times daily as needed for headache.   dorzolamide-timolol 22.3-6.8 MG/ML ophthalmic solution Commonly known as:  COSOPT Place 1 drop into both eyes 2 (two) times daily.   finasteride 5 MG tablet Commonly known as:  PROSCAR Take 1 tablet (5 mg total) by mouth daily.   finasteride 5 MG tablet Commonly known as:  PROSCAR Take 1 tablet (5 mg total) by mouth daily.   latanoprost 0.005 % ophthalmic solution Commonly known as:  XALATAN   silodosin 8 MG Caps capsule Commonly known as:  RAPAFLO Take 1 capsule (8 mg total) by mouth daily with breakfast.   simvastatin 20 MG tablet Commonly known as:  ZOCOR Take 20 mg by mouth daily.       Allergies:  Allergies  Allergen Reactions  . Advicor [Niacin-Lovastatin Er]   . Penicillins     Family History: Family History  Problem Relation Age of Onset  . Prostate cancer Father   . Heart disease Mother     Social History:  reports that he has never smoked. He has never used smokeless tobacco. He reports that he does not drink alcohol or use drugs.  ROS: UROLOGY Frequent Urination?: Yes Hard to  postpone urination?: No Burning/pain with urination?: No Get up at night to urinate?: Yes Leakage of urine?: No Urine stream starts and stops?: Yes Trouble starting stream?: No Do you have to strain to urinate?: No Blood in urine?: No Urinary tract infection?: No Sexually transmitted disease?: No Injury to kidneys or bladder?: No Painful intercourse?: No Weak stream?: No Erection problems?: No Penile pain?: No  Gastrointestinal Nausea?: No Vomiting?: No Indigestion/heartburn?: No Diarrhea?: No Constipation?: No  Constitutional Fever: No Night sweats?: No Weight loss?: No Fatigue?: No  Skin Skin rash/lesions?: No Itching?: No  Eyes Blurred vision?: No Double vision?: No  Ears/Nose/Throat Sore throat?: No Sinus problems?:  No  Hematologic/Lymphatic Swollen glands?: No Easy bruising?: No  Cardiovascular Leg swelling?: No Chest pain?: No  Respiratory Cough?: No Shortness of breath?: No  Endocrine Excessive thirst?: No  Musculoskeletal Back pain?: No Joint pain?: No  Neurological Headaches?: No Dizziness?: No  Psychologic Depression?: No Anxiety?: No  Physical Exam: BP (!) 169/95   Pulse (!) 59   Ht 5\' 8"  (1.727 m)   Wt 164 lb 6.4 oz (74.6 kg)   BMI 25.00 kg/m   Constitutional:  Alert and oriented, No acute distress. HEENT: [place] AT, moist mucus membranes.  Trachea midline, no masses. Cardiovascular: No clubbing, cyanosis, or edema. Respiratory: Normal respiratory effort, no increased work of breathing. GI: Abdomen is soft, nontender, nondistended, no abdominal masses GU: No CVA tenderness.  Skin: No rashes, bruises or suspicious lesions. Lymph: No cervical or inguinal adenopathy. Neurologic: Grossly intact, no focal deficits, moving all 4 extremities. Psychiatric: Normal mood and affect.  Laboratory Data: No results found for: WBC, HGB, HCT, MCV, PLT  No results found for: CREATININE  No results found for: PSA  No results found for: TESTOSTERONE  No results found for: HGBA1C  Urinalysis    Component Value Date/Time   APPEARANCEUR Clear 06/10/2015 1407   GLUCOSEU Negative 06/10/2015 1407   BILIRUBINUR Negative 06/10/2015 1407   PROTEINUR Negative 06/10/2015 1407   NITRITE Negative 06/10/2015 1407   LEUKOCYTESUR Negative 06/10/2015 1407     Assessment & Plan:    1. BPH -continue finasteride -follow up in one year  Return in about 1 year (around 08/04/2017).  Nickie Retort, MD  [facility] [address] [place], [place] [address] [phone]

## 2016-09-05 DIAGNOSIS — E538 Deficiency of other specified B group vitamins: Secondary | ICD-10-CM | POA: Diagnosis not present

## 2016-09-05 DIAGNOSIS — R5381 Other malaise: Secondary | ICD-10-CM | POA: Diagnosis not present

## 2016-09-05 DIAGNOSIS — Z125 Encounter for screening for malignant neoplasm of prostate: Secondary | ICD-10-CM | POA: Diagnosis not present

## 2016-09-05 DIAGNOSIS — E785 Hyperlipidemia, unspecified: Secondary | ICD-10-CM | POA: Diagnosis not present

## 2016-09-06 DIAGNOSIS — R5381 Other malaise: Secondary | ICD-10-CM | POA: Diagnosis not present

## 2016-09-06 DIAGNOSIS — E785 Hyperlipidemia, unspecified: Secondary | ICD-10-CM | POA: Diagnosis not present

## 2016-09-11 DIAGNOSIS — Z125 Encounter for screening for malignant neoplasm of prostate: Secondary | ICD-10-CM | POA: Diagnosis not present

## 2016-09-11 DIAGNOSIS — G43909 Migraine, unspecified, not intractable, without status migrainosus: Secondary | ICD-10-CM | POA: Diagnosis not present

## 2016-09-11 DIAGNOSIS — Z0000 Encounter for general adult medical examination without abnormal findings: Secondary | ICD-10-CM | POA: Diagnosis not present

## 2016-09-11 DIAGNOSIS — E785 Hyperlipidemia, unspecified: Secondary | ICD-10-CM | POA: Diagnosis not present

## 2016-09-11 DIAGNOSIS — R21 Rash and other nonspecific skin eruption: Secondary | ICD-10-CM | POA: Diagnosis not present

## 2016-09-11 DIAGNOSIS — Z23 Encounter for immunization: Secondary | ICD-10-CM | POA: Diagnosis not present

## 2016-09-11 DIAGNOSIS — E538 Deficiency of other specified B group vitamins: Secondary | ICD-10-CM | POA: Diagnosis not present

## 2016-10-10 DIAGNOSIS — H401132 Primary open-angle glaucoma, bilateral, moderate stage: Secondary | ICD-10-CM | POA: Diagnosis not present

## 2016-10-20 DIAGNOSIS — H401132 Primary open-angle glaucoma, bilateral, moderate stage: Secondary | ICD-10-CM | POA: Diagnosis not present

## 2016-12-13 ENCOUNTER — Other Ambulatory Visit: Payer: Self-pay | Admitting: Family Medicine

## 2016-12-13 DIAGNOSIS — N401 Enlarged prostate with lower urinary tract symptoms: Secondary | ICD-10-CM

## 2016-12-13 MED ORDER — FINASTERIDE 5 MG PO TABS: 5.0000 mg | ORAL_TABLET | Freq: Every day | ORAL | 5 refills | Status: DC

## 2016-12-28 ENCOUNTER — Ambulatory Visit: Payer: PPO

## 2016-12-28 ENCOUNTER — Ambulatory Visit: Payer: PPO | Admitting: Urology

## 2017-01-18 DIAGNOSIS — R51 Headache: Secondary | ICD-10-CM | POA: Diagnosis not present

## 2017-01-18 DIAGNOSIS — H539 Unspecified visual disturbance: Secondary | ICD-10-CM | POA: Diagnosis not present

## 2017-01-18 DIAGNOSIS — R05 Cough: Secondary | ICD-10-CM | POA: Diagnosis not present

## 2017-01-18 DIAGNOSIS — Z8709 Personal history of other diseases of the respiratory system: Secondary | ICD-10-CM | POA: Diagnosis not present

## 2017-01-19 ENCOUNTER — Other Ambulatory Visit: Payer: Self-pay | Admitting: Family Medicine

## 2017-01-19 DIAGNOSIS — R519 Headache, unspecified: Secondary | ICD-10-CM

## 2017-01-19 DIAGNOSIS — R51 Headache: Principal | ICD-10-CM

## 2017-01-25 ENCOUNTER — Ambulatory Visit
Admission: RE | Admit: 2017-01-25 | Discharge: 2017-01-25 | Disposition: A | Discharge status: Home / Self Care | Payer: PPO | Source: Ambulatory Visit | Attending: Family Medicine | Admitting: Family Medicine

## 2017-01-25 ENCOUNTER — Ambulatory Visit: Payer: PPO

## 2017-01-25 DIAGNOSIS — I6782 Cerebral ischemia: Secondary | ICD-10-CM | POA: Diagnosis not present

## 2017-01-25 DIAGNOSIS — R51 Headache: Secondary | ICD-10-CM | POA: Diagnosis not present

## 2017-01-25 DIAGNOSIS — G930 Cerebral cysts: Secondary | ICD-10-CM | POA: Insufficient documentation

## 2017-01-25 DIAGNOSIS — R519 Headache, unspecified: Secondary | ICD-10-CM

## 2017-01-25 DIAGNOSIS — R42 Dizziness and giddiness: Secondary | ICD-10-CM | POA: Diagnosis not present

## 2017-04-24 DIAGNOSIS — H401132 Primary open-angle glaucoma, bilateral, moderate stage: Secondary | ICD-10-CM | POA: Diagnosis not present

## 2017-05-15 DIAGNOSIS — G43909 Migraine, unspecified, not intractable, without status migrainosus: Secondary | ICD-10-CM | POA: Diagnosis not present

## 2017-05-15 DIAGNOSIS — E785 Hyperlipidemia, unspecified: Secondary | ICD-10-CM | POA: Diagnosis not present

## 2017-05-15 DIAGNOSIS — Z125 Encounter for screening for malignant neoplasm of prostate: Secondary | ICD-10-CM | POA: Diagnosis not present

## 2017-05-15 DIAGNOSIS — E538 Deficiency of other specified B group vitamins: Secondary | ICD-10-CM | POA: Diagnosis not present

## 2017-07-31 DIAGNOSIS — R51 Headache: Secondary | ICD-10-CM | POA: Diagnosis not present

## 2017-07-31 DIAGNOSIS — R5383 Other fatigue: Secondary | ICD-10-CM | POA: Diagnosis not present

## 2017-07-31 DIAGNOSIS — R030 Elevated blood-pressure reading, without diagnosis of hypertension: Secondary | ICD-10-CM | POA: Diagnosis not present

## 2017-07-31 DIAGNOSIS — E538 Deficiency of other specified B group vitamins: Secondary | ICD-10-CM | POA: Diagnosis not present

## 2017-07-31 DIAGNOSIS — R5381 Other malaise: Secondary | ICD-10-CM | POA: Diagnosis not present

## 2017-08-01 DIAGNOSIS — R030 Elevated blood-pressure reading, without diagnosis of hypertension: Secondary | ICD-10-CM | POA: Diagnosis not present

## 2017-08-01 DIAGNOSIS — E538 Deficiency of other specified B group vitamins: Secondary | ICD-10-CM | POA: Diagnosis not present

## 2017-08-01 DIAGNOSIS — R51 Headache: Secondary | ICD-10-CM | POA: Diagnosis not present

## 2017-08-01 DIAGNOSIS — R5381 Other malaise: Secondary | ICD-10-CM | POA: Diagnosis not present

## 2017-08-01 DIAGNOSIS — R5383 Other fatigue: Secondary | ICD-10-CM | POA: Diagnosis not present

## 2017-08-02 ENCOUNTER — Encounter: Payer: Self-pay | Admitting: Urology

## 2017-08-02 ENCOUNTER — Ambulatory Visit (INDEPENDENT_AMBULATORY_CARE_PROVIDER_SITE_OTHER): Payer: PPO | Admitting: Urology

## 2017-08-02 ENCOUNTER — Ambulatory Visit: Payer: PPO

## 2017-08-02 VITALS — BP 148/85 | HR 74 | Ht 68.0 in | Wt 166.6 lb

## 2017-08-02 DIAGNOSIS — N400 Enlarged prostate without lower urinary tract symptoms: Secondary | ICD-10-CM

## 2017-08-02 NOTE — Progress Notes (Signed)
08/02/2017 1:58 PM   Adrian Mitchell 1941/01/29 [id number]  Referring provider: [place] Pink, MD [address] [place], [place] [address]  Chief Complaint  Patient presents with  . Benign Prostatic Hypertrophy    HPI: The patient is a 77 year old gentleman with a past medical history of BPH on finasteride presents for annual follow-up.  The patient notes that he is happy with his current urinary quality of life.  He does have nocturia x2-3.  He does note he has to strain at times to start a stream.  He does feel he empties his bladder.  His stream is often weak with some intermittency.  However despite this, he is not concerned about his urinary status.  He is not interested in any other medications or surgical interventions to improve  his urination.  He has been on Flomax in the past but was unable to tolerate it due to dizziness.    PMH: Past Medical History:  Diagnosis Date  . ED (erectile dysfunction)   . Glaucoma   . Heart murmur   . History of shingles   . HLD (hyperlipidemia)   . Migraine   . Postherpetic neuralgia     Surgical History: Past Surgical History:  Procedure Laterality Date  . COLONOSCOPY  2013  . CYSTOSCOPY  2016  . HERNIA REPAIR     49yrs ago    Home Medications:  Allergies as of 08/02/2017      Reactions   Advicor [niacin-lovastatin Er]    Penicillins       Medication List        Accurate as of 08/02/17  1:58 PM. Always use your most recent med list.          aspirin 81 MG tablet Take 81 mg by mouth 2 (two) times daily.   brimonidine 0.2 % ophthalmic solution Commonly known as:  ALPHAGAN Place 1 drop into both eyes 2 (two) times daily.   butalbital-acetaminophen-caffeine 50-325-40 MG tablet Commonly known as:  FIORICET, ESGIC Take 1 tablet by mouth 2 (two) times daily as needed for headache.   dorzolamide-timolol 22.3-6.8 MG/ML ophthalmic solution Commonly known as:  COSOPT Place 1 drop into both eyes 2  (two) times daily.   finasteride 5 MG tablet Commonly known as:  PROSCAR Take 1 tablet (5 mg total) by mouth daily.   latanoprost 0.005 % ophthalmic solution Commonly known as:  XALATAN   simvastatin 20 MG tablet Commonly known as:  ZOCOR Take 20 mg by mouth daily.       Allergies:  Allergies  Allergen Reactions  . Advicor [Niacin-Lovastatin Er]   . Penicillins     Family History: Family History  Problem Relation Age of Onset  . Prostate cancer Father   . Heart disease Mother     Social History:  reports that  has never smoked. he has never used smokeless tobacco. He reports that he does not drink alcohol or use drugs.  ROS: UROLOGY Frequent Urination?: Yes Hard to postpone urination?: No Burning/pain with urination?: No Get up at night to urinate?: Yes Leakage of urine?: No Urine stream starts and stops?: Yes Trouble starting stream?: No Do you have to strain to urinate?: No Blood in urine?: No Urinary tract infection?: No Sexually transmitted disease?: No Injury to kidneys or bladder?: No Painful intercourse?: No Weak stream?: No Erection problems?: No Penile pain?: No  Gastrointestinal Nausea?: No Vomiting?: No Indigestion/heartburn?: No Diarrhea?: No Constipation?: No  Constitutional Fever: No Night  sweats?: No Weight loss?: No Fatigue?: Yes  Skin Skin rash/lesions?: No Itching?: No  Eyes Blurred vision?: No Double vision?: No  Ears/Nose/Throat Sore throat?: No Sinus problems?: No  Hematologic/Lymphatic Swollen glands?: No Easy bruising?: No  Cardiovascular Leg swelling?: No Chest pain?: No  Respiratory Cough?: No Shortness of breath?: No  Endocrine Excessive thirst?: No  Musculoskeletal Back pain?: No Joint pain?: No  Neurological Headaches?: No Dizziness?: No  Psychologic Depression?: No Anxiety?: No  Physical Exam: BP (!) 148/85 (BP Location: Left Arm, Patient Position: Sitting, Cuff Size: Normal)   Pulse  74   Ht 5\' 8"  (1.727 m)   Wt 166 lb 9.6 oz (75.6 kg)   BMI 25.33 kg/m   Constitutional:  Alert and oriented, No acute distress. HEENT: [place] AT, moist mucus membranes.  Trachea midline, no masses. Cardiovascular: No clubbing, cyanosis, or edema. Respiratory: Normal respiratory effort, no increased work of breathing. GI: Abdomen is soft, nontender, nondistended, no abdominal masses GU: No CVA tenderness.  Skin: No rashes, bruises or suspicious lesions. Lymph: No cervical or inguinal adenopathy. Neurologic: Grossly intact, no focal deficits, moving all 4 extremities. Psychiatric: Normal mood and affect.  Laboratory Data: No results found for: WBC, HGB, HCT, MCV, PLT  No results found for: CREATININE  No results found for: PSA  No results found for: TESTOSTERONE  No results found for: HGBA1C  Urinalysis    Component Value Date/Time   APPEARANCEUR Clear 08/04/2016 1439   GLUCOSEU Negative 08/04/2016 1439   BILIRUBINUR Negative 08/04/2016 1439   PROTEINUR Negative 08/04/2016 1439   NITRITE Negative 08/04/2016 1439   LEUKOCYTESUR Negative 08/04/2016 1439    Assessment & Plan:    1. BPH -continue finasteride -follow up in one year  No Follow-up on file.  Nickie Retort, MD  [facility] [address] [place], [place] [address] [phone]

## 2017-08-16 DIAGNOSIS — R51 Headache: Secondary | ICD-10-CM | POA: Diagnosis not present

## 2017-08-16 DIAGNOSIS — I10 Essential (primary) hypertension: Secondary | ICD-10-CM | POA: Diagnosis not present

## 2017-09-03 DIAGNOSIS — G44229 Chronic tension-type headache, not intractable: Secondary | ICD-10-CM | POA: Diagnosis not present

## 2017-09-03 DIAGNOSIS — G930 Cerebral cysts: Secondary | ICD-10-CM | POA: Diagnosis not present

## 2017-09-03 DIAGNOSIS — G43119 Migraine with aura, intractable, without status migrainosus: Secondary | ICD-10-CM | POA: Diagnosis not present

## 2017-09-06 DIAGNOSIS — E785 Hyperlipidemia, unspecified: Secondary | ICD-10-CM | POA: Diagnosis not present

## 2017-09-06 DIAGNOSIS — Z0000 Encounter for general adult medical examination without abnormal findings: Secondary | ICD-10-CM | POA: Diagnosis not present

## 2017-09-06 DIAGNOSIS — Z125 Encounter for screening for malignant neoplasm of prostate: Secondary | ICD-10-CM | POA: Diagnosis not present

## 2017-09-13 DIAGNOSIS — R51 Headache: Secondary | ICD-10-CM | POA: Diagnosis not present

## 2017-09-13 DIAGNOSIS — Z0000 Encounter for general adult medical examination without abnormal findings: Secondary | ICD-10-CM | POA: Diagnosis not present

## 2017-09-13 DIAGNOSIS — Z23 Encounter for immunization: Secondary | ICD-10-CM | POA: Diagnosis not present

## 2017-09-13 DIAGNOSIS — E785 Hyperlipidemia, unspecified: Secondary | ICD-10-CM | POA: Diagnosis not present

## 2017-10-11 ENCOUNTER — Other Ambulatory Visit: Payer: Self-pay

## 2017-10-11 DIAGNOSIS — N401 Enlarged prostate with lower urinary tract symptoms: Secondary | ICD-10-CM

## 2017-10-11 MED ORDER — FINASTERIDE 5 MG PO TABS: 5.0000 mg | ORAL_TABLET | Freq: Every day | ORAL | 5 refills | Status: DC

## 2017-10-30 DIAGNOSIS — H26061 Combined forms of infantile and juvenile cataract, right eye: Secondary | ICD-10-CM | POA: Diagnosis not present

## 2017-10-30 DIAGNOSIS — H401111 Primary open-angle glaucoma, right eye, mild stage: Secondary | ICD-10-CM | POA: Diagnosis not present

## 2017-10-30 DIAGNOSIS — H269 Unspecified cataract: Secondary | ICD-10-CM | POA: Diagnosis not present

## 2017-10-30 DIAGNOSIS — Z961 Presence of intraocular lens: Secondary | ICD-10-CM | POA: Diagnosis not present

## 2017-10-30 DIAGNOSIS — H401123 Primary open-angle glaucoma, left eye, severe stage: Secondary | ICD-10-CM | POA: Diagnosis not present

## 2017-12-17 DIAGNOSIS — R5383 Other fatigue: Secondary | ICD-10-CM | POA: Diagnosis not present

## 2017-12-17 DIAGNOSIS — E538 Deficiency of other specified B group vitamins: Secondary | ICD-10-CM | POA: Diagnosis not present

## 2017-12-31 DIAGNOSIS — N401 Enlarged prostate with lower urinary tract symptoms: Secondary | ICD-10-CM | POA: Diagnosis not present

## 2017-12-31 DIAGNOSIS — R351 Nocturia: Secondary | ICD-10-CM | POA: Diagnosis not present

## 2017-12-31 DIAGNOSIS — R3914 Feeling of incomplete bladder emptying: Secondary | ICD-10-CM | POA: Diagnosis not present

## 2018-01-07 DIAGNOSIS — L719 Rosacea, unspecified: Secondary | ICD-10-CM | POA: Diagnosis not present

## 2018-01-09 DIAGNOSIS — H401132 Primary open-angle glaucoma, bilateral, moderate stage: Secondary | ICD-10-CM | POA: Diagnosis not present

## 2018-01-11 DIAGNOSIS — H401123 Primary open-angle glaucoma, left eye, severe stage: Secondary | ICD-10-CM | POA: Diagnosis not present

## 2018-01-18 DIAGNOSIS — H401123 Primary open-angle glaucoma, left eye, severe stage: Secondary | ICD-10-CM | POA: Diagnosis not present

## 2018-02-05 DIAGNOSIS — H401123 Primary open-angle glaucoma, left eye, severe stage: Secondary | ICD-10-CM | POA: Diagnosis not present

## 2018-03-05 DIAGNOSIS — H401123 Primary open-angle glaucoma, left eye, severe stage: Secondary | ICD-10-CM | POA: Diagnosis not present

## 2018-03-07 DIAGNOSIS — G43109 Migraine with aura, not intractable, without status migrainosus: Secondary | ICD-10-CM | POA: Diagnosis not present

## 2018-03-19 DIAGNOSIS — R5383 Other fatigue: Secondary | ICD-10-CM | POA: Diagnosis not present

## 2018-03-19 DIAGNOSIS — R51 Headache: Secondary | ICD-10-CM | POA: Diagnosis not present

## 2018-03-19 DIAGNOSIS — I10 Essential (primary) hypertension: Secondary | ICD-10-CM | POA: Diagnosis not present

## 2018-03-21 DIAGNOSIS — I10 Essential (primary) hypertension: Secondary | ICD-10-CM | POA: Diagnosis not present

## 2018-03-21 DIAGNOSIS — F439 Reaction to severe stress, unspecified: Secondary | ICD-10-CM | POA: Diagnosis not present

## 2018-04-09 DIAGNOSIS — H401123 Primary open-angle glaucoma, left eye, severe stage: Secondary | ICD-10-CM | POA: Diagnosis not present

## 2018-04-11 DIAGNOSIS — L719 Rosacea, unspecified: Secondary | ICD-10-CM | POA: Diagnosis not present

## 2018-04-11 DIAGNOSIS — L821 Other seborrheic keratosis: Secondary | ICD-10-CM | POA: Diagnosis not present

## 2018-04-23 DIAGNOSIS — Z125 Encounter for screening for malignant neoplasm of prostate: Secondary | ICD-10-CM | POA: Diagnosis not present

## 2018-04-23 DIAGNOSIS — I10 Essential (primary) hypertension: Secondary | ICD-10-CM | POA: Diagnosis not present

## 2018-04-23 DIAGNOSIS — H401123 Primary open-angle glaucoma, left eye, severe stage: Secondary | ICD-10-CM | POA: Diagnosis not present

## 2018-05-07 ENCOUNTER — Other Ambulatory Visit: Payer: Self-pay

## 2018-05-07 DIAGNOSIS — N401 Enlarged prostate with lower urinary tract symptoms: Secondary | ICD-10-CM

## 2018-05-07 MED ORDER — FINASTERIDE 5 MG PO TABS: 5.0000 mg | ORAL_TABLET | Freq: Every day | ORAL | 5 refills | Status: DC

## 2018-05-28 DIAGNOSIS — H401411 Capsular glaucoma with pseudoexfoliation of lens, right eye, mild stage: Secondary | ICD-10-CM | POA: Diagnosis not present

## 2018-06-11 DIAGNOSIS — Z880 Allergy status to penicillin: Secondary | ICD-10-CM | POA: Diagnosis not present

## 2018-06-11 DIAGNOSIS — H401493 Capsular glaucoma with pseudoexfoliation of lens, unspecified eye, severe stage: Secondary | ICD-10-CM | POA: Diagnosis not present

## 2018-06-11 DIAGNOSIS — I10 Essential (primary) hypertension: Secondary | ICD-10-CM | POA: Diagnosis not present

## 2018-06-11 DIAGNOSIS — Z7982 Long term (current) use of aspirin: Secondary | ICD-10-CM | POA: Diagnosis not present

## 2018-06-11 DIAGNOSIS — H401423 Capsular glaucoma with pseudoexfoliation of lens, left eye, severe stage: Secondary | ICD-10-CM | POA: Diagnosis not present

## 2018-06-19 ENCOUNTER — Ambulatory Visit: Admit: 2018-06-19 | Payer: PPO | Admitting: Ophthalmology

## 2018-06-19 SURGERY — TRABECULECTOMY
Anesthesia: Regional | Laterality: Left

## 2018-07-16 DIAGNOSIS — G43109 Migraine with aura, not intractable, without status migrainosus: Secondary | ICD-10-CM | POA: Diagnosis not present

## 2018-07-16 DIAGNOSIS — G930 Cerebral cysts: Secondary | ICD-10-CM | POA: Diagnosis not present

## 2018-07-30 DIAGNOSIS — I10 Essential (primary) hypertension: Secondary | ICD-10-CM | POA: Diagnosis not present

## 2018-07-30 DIAGNOSIS — K5900 Constipation, unspecified: Secondary | ICD-10-CM | POA: Diagnosis not present

## 2018-07-30 DIAGNOSIS — G43909 Migraine, unspecified, not intractable, without status migrainosus: Secondary | ICD-10-CM | POA: Diagnosis not present

## 2018-08-06 ENCOUNTER — Encounter: Payer: Self-pay | Admitting: Urology

## 2018-08-06 ENCOUNTER — Ambulatory Visit: Payer: PPO | Admitting: Urology

## 2018-08-06 VITALS — BP 169/67 | HR 87 | Ht 68.0 in | Wt 165.0 lb

## 2018-08-06 DIAGNOSIS — N401 Enlarged prostate with lower urinary tract symptoms: Secondary | ICD-10-CM | POA: Diagnosis not present

## 2018-08-06 LAB — BLADDER SCAN AMB NON-IMAGING

## 2018-08-06 MED ORDER — FINASTERIDE 5 MG PO TABS: 5.0000 mg | ORAL_TABLET | Freq: Every day | ORAL | 11 refills | Status: DC

## 2018-08-06 NOTE — Progress Notes (Signed)
   08/06/2018 2:00 PM   Dameion B Hoaglin [birth year] [id number]  Reason for visit: Follow up BPH/LUTS  HPI: I saw Mr. Bergstresser in urology clinic today for follow-up of BPH/LUTS.  Briefly, he is a healthy 78 year old male that was previously followed by Dr. Pilar Jarvis.  He has a long history of urinary symptoms, which have been well managed on daily finasteride.  He did not tolerate Flomax in the past secondary to dizziness.  His primary urinary complaint is nocturia 2-3 times per night.  He has minimal urinary complaints during the day.  PVR in clinic today is 4 cc.  He denies any gross hematuria, history of urinary retention, or urinary tract infections.  There is no recent imaging to evaluate prostate volume.  He has a distant history of cystoscopy with Dr. Yves Dill, which reportedly showed prostatic enlargement but no stricture or other pathology.  He also had questions today about PSA screening.  PSA April 2018 0.55(corrected for finasteride 1.1), stable from prior.  I recommended continuing finasteride as he has overall very mild urinary symptoms with addition of this medication.  We discussed strategies for minimizing nocturia, including minimizing fluids after 6 PM in the evening, and double voiding prior to bed.  We also briefly discussed the AUA guidelines on PSA screening, and that PSA screening is not recommended in patients over 70.  We also reviewed that his last PSA was well within the normal range for his age.  Discontinue further PSA screening RTC 1 year for symptom check, continue finasteride  A total of 15 minutes were spent face-to-face with the patient, greater than 50% was spent in patient education, counseling, and coordination of care regarding BPH and PSA screening.   Billey Co, [facility] [address], [place], [place] [address] [phone]

## 2018-09-20 DIAGNOSIS — H401123 Primary open-angle glaucoma, left eye, severe stage: Secondary | ICD-10-CM | POA: Diagnosis not present

## 2018-09-23 DIAGNOSIS — F439 Reaction to severe stress, unspecified: Secondary | ICD-10-CM | POA: Diagnosis not present

## 2018-09-23 DIAGNOSIS — N401 Enlarged prostate with lower urinary tract symptoms: Secondary | ICD-10-CM | POA: Diagnosis not present

## 2018-09-23 DIAGNOSIS — R3911 Hesitancy of micturition: Secondary | ICD-10-CM | POA: Diagnosis not present

## 2018-09-23 DIAGNOSIS — I10 Essential (primary) hypertension: Secondary | ICD-10-CM | POA: Diagnosis not present

## 2018-09-26 DIAGNOSIS — I10 Essential (primary) hypertension: Secondary | ICD-10-CM | POA: Diagnosis not present

## 2018-11-19 DIAGNOSIS — Z125 Encounter for screening for malignant neoplasm of prostate: Secondary | ICD-10-CM | POA: Diagnosis not present

## 2018-11-19 DIAGNOSIS — I10 Essential (primary) hypertension: Secondary | ICD-10-CM | POA: Diagnosis not present

## 2018-11-19 DIAGNOSIS — H401123 Primary open-angle glaucoma, left eye, severe stage: Secondary | ICD-10-CM | POA: Diagnosis not present

## 2018-11-21 DIAGNOSIS — R748 Abnormal levels of other serum enzymes: Secondary | ICD-10-CM | POA: Diagnosis not present

## 2018-11-21 DIAGNOSIS — N400 Enlarged prostate without lower urinary tract symptoms: Secondary | ICD-10-CM | POA: Diagnosis not present

## 2018-11-21 DIAGNOSIS — Z0000 Encounter for general adult medical examination without abnormal findings: Secondary | ICD-10-CM | POA: Diagnosis not present

## 2018-11-21 DIAGNOSIS — I10 Essential (primary) hypertension: Secondary | ICD-10-CM | POA: Diagnosis not present

## 2018-11-21 DIAGNOSIS — E785 Hyperlipidemia, unspecified: Secondary | ICD-10-CM | POA: Diagnosis not present

## 2018-12-10 ENCOUNTER — Other Ambulatory Visit: Payer: Self-pay | Admitting: Gastroenterology

## 2018-12-10 DIAGNOSIS — R748 Abnormal levels of other serum enzymes: Secondary | ICD-10-CM

## 2018-12-16 ENCOUNTER — Other Ambulatory Visit: Payer: Self-pay

## 2018-12-16 ENCOUNTER — Ambulatory Visit
Admission: RE | Admit: 2018-12-16 | Discharge: 2018-12-16 | Disposition: A | Discharge status: Home / Self Care | Payer: PPO | Source: Ambulatory Visit | Attending: Gastroenterology | Admitting: Gastroenterology

## 2018-12-16 DIAGNOSIS — R748 Abnormal levels of other serum enzymes: Secondary | ICD-10-CM | POA: Diagnosis not present

## 2018-12-16 DIAGNOSIS — N200 Calculus of kidney: Secondary | ICD-10-CM | POA: Diagnosis not present

## 2019-01-21 DIAGNOSIS — R748 Abnormal levels of other serum enzymes: Secondary | ICD-10-CM | POA: Diagnosis not present

## 2019-01-22 DIAGNOSIS — R5383 Other fatigue: Secondary | ICD-10-CM | POA: Diagnosis not present

## 2019-01-22 DIAGNOSIS — R55 Syncope and collapse: Secondary | ICD-10-CM | POA: Diagnosis not present

## 2019-02-07 DIAGNOSIS — R42 Dizziness and giddiness: Secondary | ICD-10-CM | POA: Diagnosis not present

## 2019-02-07 DIAGNOSIS — E785 Hyperlipidemia, unspecified: Secondary | ICD-10-CM | POA: Diagnosis not present

## 2019-02-18 DIAGNOSIS — H401123 Primary open-angle glaucoma, left eye, severe stage: Secondary | ICD-10-CM | POA: Diagnosis not present

## 2019-02-25 DIAGNOSIS — H401123 Primary open-angle glaucoma, left eye, severe stage: Secondary | ICD-10-CM | POA: Diagnosis not present

## 2019-02-25 DIAGNOSIS — R42 Dizziness and giddiness: Secondary | ICD-10-CM | POA: Diagnosis not present

## 2019-02-25 DIAGNOSIS — I6523 Occlusion and stenosis of bilateral carotid arteries: Secondary | ICD-10-CM | POA: Diagnosis not present

## 2019-03-04 ENCOUNTER — Encounter: Payer: Self-pay | Admitting: Anesthesiology

## 2019-03-07 ENCOUNTER — Other Ambulatory Visit: Admission: RE | Admit: 2019-03-07 | Payer: PPO | Source: Ambulatory Visit

## 2019-03-12 ENCOUNTER — Ambulatory Visit: Admit: 2019-03-12 | Payer: PPO | Admitting: Ophthalmology

## 2019-03-12 SURGERY — PHACOEMULSIFICATION, CATARACT, WITH IOL INSERTION
Anesthesia: Topical | Laterality: Right

## 2019-03-18 DIAGNOSIS — R748 Abnormal levels of other serum enzymes: Secondary | ICD-10-CM | POA: Diagnosis not present

## 2019-03-21 DIAGNOSIS — R3914 Feeling of incomplete bladder emptying: Secondary | ICD-10-CM | POA: Diagnosis not present

## 2019-03-21 DIAGNOSIS — Z125 Encounter for screening for malignant neoplasm of prostate: Secondary | ICD-10-CM | POA: Diagnosis not present

## 2019-03-21 DIAGNOSIS — N401 Enlarged prostate with lower urinary tract symptoms: Secondary | ICD-10-CM | POA: Diagnosis not present

## 2019-03-21 DIAGNOSIS — R351 Nocturia: Secondary | ICD-10-CM | POA: Diagnosis not present

## 2019-03-24 DIAGNOSIS — R351 Nocturia: Secondary | ICD-10-CM | POA: Diagnosis not present

## 2019-03-24 DIAGNOSIS — R3914 Feeling of incomplete bladder emptying: Secondary | ICD-10-CM | POA: Diagnosis not present

## 2019-03-24 DIAGNOSIS — N401 Enlarged prostate with lower urinary tract symptoms: Secondary | ICD-10-CM | POA: Diagnosis not present

## 2019-03-24 DIAGNOSIS — Z125 Encounter for screening for malignant neoplasm of prostate: Secondary | ICD-10-CM | POA: Diagnosis not present

## 2019-04-09 DIAGNOSIS — J3489 Other specified disorders of nose and nasal sinuses: Secondary | ICD-10-CM | POA: Diagnosis not present

## 2019-04-09 DIAGNOSIS — R05 Cough: Secondary | ICD-10-CM | POA: Diagnosis not present

## 2019-06-03 DIAGNOSIS — I10 Essential (primary) hypertension: Secondary | ICD-10-CM | POA: Diagnosis not present

## 2019-06-03 DIAGNOSIS — E785 Hyperlipidemia, unspecified: Secondary | ICD-10-CM | POA: Diagnosis not present

## 2019-06-03 DIAGNOSIS — G43109 Migraine with aura, not intractable, without status migrainosus: Secondary | ICD-10-CM | POA: Diagnosis not present

## 2019-06-03 DIAGNOSIS — R748 Abnormal levels of other serum enzymes: Secondary | ICD-10-CM | POA: Diagnosis not present

## 2019-06-20 DIAGNOSIS — H401112 Primary open-angle glaucoma, right eye, moderate stage: Secondary | ICD-10-CM | POA: Diagnosis not present

## 2019-07-04 DIAGNOSIS — H2511 Age-related nuclear cataract, right eye: Secondary | ICD-10-CM | POA: Diagnosis not present

## 2019-07-04 DIAGNOSIS — I10 Essential (primary) hypertension: Secondary | ICD-10-CM | POA: Diagnosis not present

## 2019-07-17 ENCOUNTER — Encounter: Payer: Self-pay | Admitting: Ophthalmology

## 2019-07-17 ENCOUNTER — Other Ambulatory Visit: Payer: Self-pay

## 2019-07-18 NOTE — Discharge Instructions (Signed)

## 2019-07-21 ENCOUNTER — Other Ambulatory Visit: Payer: Self-pay

## 2019-07-21 ENCOUNTER — Other Ambulatory Visit
Admission: RE | Admit: 2019-07-21 | Discharge: 2019-07-21 | Disposition: A | Discharge status: Home / Self Care | Payer: PPO | Source: Ambulatory Visit | Attending: Ophthalmology | Admitting: Ophthalmology

## 2019-07-21 DIAGNOSIS — Z01812 Encounter for preprocedural laboratory examination: Secondary | ICD-10-CM | POA: Diagnosis not present

## 2019-07-21 DIAGNOSIS — Z20822 Contact with and (suspected) exposure to covid-19: Secondary | ICD-10-CM | POA: Diagnosis not present

## 2019-07-22 LAB — SARS CORONAVIRUS 2 (TAT 6-24 HRS): SARS Coronavirus 2: NEGATIVE

## 2019-07-23 ENCOUNTER — Ambulatory Visit: Payer: PPO | Admitting: Anesthesiology

## 2019-07-23 ENCOUNTER — Encounter: Payer: Self-pay | Admitting: Ophthalmology

## 2019-07-23 ENCOUNTER — Encounter
Admission: RE | Disposition: A | Discharge status: Home / Self Care | Payer: Self-pay | Source: Home / Self Care | Attending: Ophthalmology

## 2019-07-23 ENCOUNTER — Ambulatory Visit
Admission: RE | Admit: 2019-07-23 | Discharge: 2019-07-23 | Disposition: A | Discharge status: Home / Self Care | Payer: PPO | Attending: Ophthalmology | Admitting: Ophthalmology

## 2019-07-23 ENCOUNTER — Other Ambulatory Visit: Payer: Self-pay

## 2019-07-23 DIAGNOSIS — H25811 Combined forms of age-related cataract, right eye: Secondary | ICD-10-CM | POA: Diagnosis not present

## 2019-07-23 DIAGNOSIS — H2511 Age-related nuclear cataract, right eye: Secondary | ICD-10-CM | POA: Diagnosis not present

## 2019-07-23 DIAGNOSIS — Z7982 Long term (current) use of aspirin: Secondary | ICD-10-CM | POA: Diagnosis not present

## 2019-07-23 DIAGNOSIS — I10 Essential (primary) hypertension: Secondary | ICD-10-CM | POA: Diagnosis not present

## 2019-07-23 DIAGNOSIS — G43909 Migraine, unspecified, not intractable, without status migrainosus: Secondary | ICD-10-CM | POA: Diagnosis not present

## 2019-07-23 DIAGNOSIS — Z79899 Other long term (current) drug therapy: Secondary | ICD-10-CM | POA: Diagnosis not present

## 2019-07-23 HISTORY — DX: Presence of dental prosthetic device (complete) (partial): Z97.2

## 2019-07-23 HISTORY — PX: CATARACT EXTRACTION W/PHACO: SHX586

## 2019-07-23 SURGERY — PHACOEMULSIFICATION, CATARACT, WITH IOL INSERTION
Anesthesia: Monitor Anesthesia Care | Site: Eye | Laterality: Right

## 2019-07-23 MED ORDER — MOXIFLOXACIN HCL 0.5 % OP SOLN
1.0000 [drp] | OPHTHALMIC | Status: DC | PRN
  Administered 2019-07-23 (×3): 1 [drp] via OPHTHALMIC

## 2019-07-23 MED ORDER — EPINEPHRINE PF 1 MG/ML IJ SOLN
INTRAOCULAR | Status: DC | PRN
  Administered 2019-07-23: 55 mL via OPHTHALMIC

## 2019-07-23 MED ORDER — ARMC OPHTHALMIC DILATING DROPS
1.0000 "application " | OPHTHALMIC | Status: DC | PRN
  Administered 2019-07-23 (×3): 1 via OPHTHALMIC

## 2019-07-23 MED ORDER — BRIMONIDINE TARTRATE-TIMOLOL 0.2-0.5 % OP SOLN
OPHTHALMIC | Status: DC | PRN
  Administered 2019-07-23: 1 [drp] via OPHTHALMIC

## 2019-07-23 MED ORDER — TETRACAINE HCL 0.5 % OP SOLN
1.0000 [drp] | OPHTHALMIC | Status: DC | PRN
  Administered 2019-07-23 (×2): 1 [drp] via OPHTHALMIC

## 2019-07-23 MED ORDER — FENTANYL CITRATE (PF) 100 MCG/2ML IJ SOLN
INTRAMUSCULAR | Status: DC | PRN
  Administered 2019-07-23: 50 ug via INTRAVENOUS

## 2019-07-23 MED ORDER — CEFUROXIME OPHTHALMIC INJECTION 1 MG/0.1 ML
INJECTION | OPHTHALMIC | Status: DC | PRN
  Administered 2019-07-23: 0.1 mL via INTRACAMERAL

## 2019-07-23 MED ORDER — NA HYALUR & NA CHOND-NA HYALUR 0.4-0.35 ML IO KIT
PACK | INTRAOCULAR | Status: DC | PRN
  Administered 2019-07-23: 1 mL via INTRAOCULAR

## 2019-07-23 MED ORDER — LIDOCAINE HCL (PF) 2 % IJ SOLN
INTRAOCULAR | Status: DC | PRN
  Administered 2019-07-23: 2 mL

## 2019-07-23 MED ORDER — MIDAZOLAM HCL 2 MG/2ML IJ SOLN
INTRAMUSCULAR | Status: DC | PRN
  Administered 2019-07-23: 1 mg via INTRAVENOUS

## 2019-07-23 SURGICAL SUPPLY — 24 items
CANNULA ANT/CHMB 27G (MISCELLANEOUS) ×1 IMPLANT
CANNULA ANT/CHMB 27GA (MISCELLANEOUS) ×3 IMPLANT
GLOVE SURG LX 7.5 STRW (GLOVE) ×2
GLOVE SURG LX STRL 7.5 STRW (GLOVE) ×1 IMPLANT
GLOVE SURG TRIUMPH 8.0 PF LTX (GLOVE) ×3 IMPLANT
GOWN STRL REUS W/ TWL LRG LVL3 (GOWN DISPOSABLE) ×2 IMPLANT
GOWN STRL REUS W/TWL LRG LVL3 (GOWN DISPOSABLE) ×4
LENS IOL ACRSF IQ ULTRA 19.0 (Intraocular Lens) IMPLANT
LENS IOL ACRYSOF IQ 19.0 (Intraocular Lens) ×3 IMPLANT
MARKER SKIN DUAL TIP RULER LAB (MISCELLANEOUS) ×3 IMPLANT
NDL CAPSULORHEX 25GA (NEEDLE) ×1 IMPLANT
NDL FILTER BLUNT 18X1 1/2 (NEEDLE) ×2 IMPLANT
NEEDLE CAPSULORHEX 25GA (NEEDLE) ×3 IMPLANT
NEEDLE FILTER BLUNT 18X 1/2SAF (NEEDLE) ×4
NEEDLE FILTER BLUNT 18X1 1/2 (NEEDLE) ×2 IMPLANT
PACK CATARACT BRASINGTON (MISCELLANEOUS) ×3 IMPLANT
PACK EYE AFTER SURG (MISCELLANEOUS) ×3 IMPLANT
PACK OPTHALMIC (MISCELLANEOUS) ×3 IMPLANT
SOLUTION OPHTHALMIC SALT (MISCELLANEOUS) ×3 IMPLANT
SYR 3ML LL SCALE MARK (SYRINGE) ×6 IMPLANT
SYR TB 1ML LUER SLIP (SYRINGE) ×3 IMPLANT
TIP ITREPID SGL USE BENT I/A (SUCTIONS) ×2 IMPLANT
WATER STERILE IRR 250ML POUR (IV SOLUTION) ×3 IMPLANT
WIPE NON LINTING 3.25X3.25 (MISCELLANEOUS) ×3 IMPLANT

## 2019-07-23 NOTE — Anesthesia Postprocedure Evaluation (Signed)
Anesthesia Post Note  Patient: Adrian Mitchell  Procedure(s) Performed: CATARACT EXTRACTION PHACO AND INTRAOCULAR LENS PLACEMENT (IOC) RIGHT 6.92 00:51.1 13.5% (Right Eye)     Anesthesia Post Evaluation  Bee Hammerschmidt

## 2019-07-23 NOTE — Anesthesia Preprocedure Evaluation (Signed)
Anesthesia Evaluation  Patient identified by MRN, date of birth, ID band Patient awake    Reviewed: Allergy & Precautions, NPO status , Patient's Chart, lab work & pertinent test results  Airway Mallampati: II  TM Distance: >3 FB Neck ROM: Full    Dental no notable dental hx.    Pulmonary neg pulmonary ROS,    Pulmonary exam normal        Cardiovascular negative cardio ROS Normal cardiovascular exam     Neuro/Psych  Headaches, Cataract negative psych ROS   GI/Hepatic negative GI ROS, Neg liver ROS,   Endo/Other  negative endocrine ROS  Renal/GU negative Renal ROS     Musculoskeletal  (+) Arthritis ,   Abdominal Normal abdominal exam  (+)   Peds  Hematology negative hematology ROS (+)   Anesthesia Other Findings   Reproductive/Obstetrics                             Anesthesia Physical Anesthesia Plan  ASA: II  Anesthesia Plan: MAC   Post-op Pain Management:    Induction: Intravenous  PONV Risk Score and Plan: 1 and Ondansetron and Treatment may vary due to age or medical condition  Airway Management Planned: Nasal Cannula  Additional Equipment:   Intra-op Plan:   Post-operative Plan:   Informed Consent: I have reviewed the patients History and Physical, chart, labs and discussed the procedure including the risks, benefits and alternatives for the proposed anesthesia with the patient or authorized representative who has indicated his/her understanding and acceptance.       Plan Discussed with: CRNA  Anesthesia Plan Comments:         Anesthesia Quick Evaluation

## 2019-07-23 NOTE — Anesthesia Procedure Notes (Signed)
Procedure Name: MAC Performed by: Tahir Blank, CRNA Pre-anesthesia Checklist: Patient identified, Emergency Drugs available, Suction available, Timeout performed and Patient being monitored Patient Re-evaluated:Patient Re-evaluated prior to induction Oxygen Delivery Method: Nasal cannula Placement Confirmation: positive ETCO2       

## 2019-07-23 NOTE — Op Note (Signed)
LOCATION:  [facility]   PREOPERATIVE DIAGNOSIS:    Nuclear sclerotic cataract right eye. H25.11   POSTOPERATIVE DIAGNOSIS:  Nuclear sclerotic cataract right eye.     PROCEDURE:  Phacoemusification with posterior chamber intraocular lens placement of the right eye   ULTRASOUND TIME: Procedure(s): CATARACT EXTRACTION PHACO AND INTRAOCULAR LENS PLACEMENT (IOC) RIGHT 6.92 00:51.1 13.5% (Right)  LENS:   Implant Name Type Inv. Item Serial No. Manufacturer Lot No. LRB No. Used Action  LENS IOL ACRYSOF IQ 19.0 - [id number] Intraocular Lens LENS IOL ACRYSOF IQ 19.0 [id number] ALCON  Right 1 Implanted         SURGEON:  Wyonia Hough, MD   ANESTHESIA:  Topical with tetracaine drops and 2% Xylocaine jelly, augmented with 1% preservative-free intracameral lidocaine.    COMPLICATIONS:  None.   DESCRIPTION OF PROCEDURE:  The patient was identified in the holding room and transported to the operating room and placed in the supine position under the operating microscope.  The right eye was identified as the operative eye and it was prepped and draped in the usual sterile ophthalmic fashion.   A 1 millimeter clear-corneal paracentesis was made at the 12:00 position.  0.5 ml of preservative-free 1% lidocaine was injected into the anterior chamber. The anterior chamber was filled with Viscoat viscoelastic.  A 2.4 millimeter keratome was used to make a near-clear corneal incision at the 9:00 position.  A curvilinear capsulorrhexis was made with a cystotome and capsulorrhexis forceps.  Balanced salt solution was used to hydrodissect and hydrodelineate the nucleus.   Phacoemulsification was then used in stop and chop fashion to remove the lens nucleus and epinucleus.  The remaining cortex was then removed using the irrigation and aspiration handpiece. Provisc was then placed into the capsular bag to distend it for lens placement.  A lens was then injected into the capsular bag.  The  remaining viscoelastic was aspirated.   Wounds were hydrated with balanced salt solution.  The anterior chamber was inflated to a physiologic pressure with balanced salt solution.  No wound leaks were noted. Cefuroxime 0.1 ml of a 10mg /ml solution was injected into the anterior chamber for a dose of 1 mg of intracameral antibiotic at the completion of the case.   Timolol and Brimonidine drops were applied to the eye.  The patient was taken to the recovery room in stable condition without complications of anesthesia or surgery.   Rebel Laughridge 07/23/2019, 10:29 AM

## 2019-07-23 NOTE — Transfer of Care (Signed)
Immediate Anesthesia Transfer of Care Note  Patient: Adrian Mitchell  Procedure(s) Performed: CATARACT EXTRACTION PHACO AND INTRAOCULAR LENS PLACEMENT (IOC) RIGHT 6.92 00:51.1 13.5% (Right Eye)  Patient Location: PACU  Anesthesia Type: MAC  Level of Consciousness: awake, alert  and patient cooperative  Airway and Oxygen Therapy: Patient Spontanous Breathing and Patient connected to supplemental oxygen  Post-op Assessment: Post-op Vital signs reviewed, Patient's Cardiovascular Status Stable, Respiratory Function Stable, Patent Airway and No signs of Nausea or vomiting  Post-op Vital Signs: Reviewed and stable  Complications: No apparent anesthesia complications

## 2019-07-23 NOTE — H&P (Signed)

## 2019-07-24 ENCOUNTER — Encounter: Payer: Self-pay | Admitting: *Deleted

## 2019-08-06 ENCOUNTER — Ambulatory Visit: Payer: PPO | Admitting: Urology

## 2019-10-03 DIAGNOSIS — R748 Abnormal levels of other serum enzymes: Secondary | ICD-10-CM | POA: Diagnosis not present

## 2019-10-03 DIAGNOSIS — E785 Hyperlipidemia, unspecified: Secondary | ICD-10-CM | POA: Diagnosis not present

## 2019-10-11 ENCOUNTER — Emergency Department: Payer: PPO

## 2019-10-11 ENCOUNTER — Other Ambulatory Visit: Payer: Self-pay

## 2019-10-11 ENCOUNTER — Emergency Department
Admission: EM | Admit: 2019-10-11 | Discharge: 2019-10-11 | Disposition: A | Discharge status: Home / Self Care | Payer: PPO | Attending: Emergency Medicine | Admitting: Emergency Medicine

## 2019-10-11 DIAGNOSIS — Z7982 Long term (current) use of aspirin: Secondary | ICD-10-CM | POA: Insufficient documentation

## 2019-10-11 DIAGNOSIS — R270 Ataxia, unspecified: Secondary | ICD-10-CM | POA: Insufficient documentation

## 2019-10-11 DIAGNOSIS — R42 Dizziness and giddiness: Secondary | ICD-10-CM | POA: Insufficient documentation

## 2019-10-11 DIAGNOSIS — R519 Headache, unspecified: Secondary | ICD-10-CM | POA: Diagnosis not present

## 2019-10-11 DIAGNOSIS — Z79899 Other long term (current) drug therapy: Secondary | ICD-10-CM | POA: Diagnosis not present

## 2019-10-11 LAB — BASIC METABOLIC PANEL
Anion gap: 7 (ref 5–15)
BUN: 17 mg/dL (ref 8–23)
CO2: 25 mmol/L (ref 22–32)
Calcium: 9.4 mg/dL (ref 8.9–10.3)
Chloride: 106 mmol/L (ref 98–111)
Creatinine, Ser: 1.21 mg/dL (ref 0.61–1.24)
GFR calc Af Amer: >60 mL/min (ref >60)
GFR calc non Af Amer: 56 mL/min — ABNORMAL LOW (ref >60)
Glucose, Bld: 121 mg/dL — ABNORMAL HIGH (ref 70–99)
Potassium: 4.5 mmol/L (ref 3.5–5.1)
Sodium: 138 mmol/L (ref 135–145)

## 2019-10-11 LAB — URINALYSIS, COMPLETE (UACMP) WITH MICROSCOPIC
Bacteria, UA: NONE SEEN
Bilirubin Urine: NEGATIVE
Glucose, UA: NEGATIVE mg/dL
Hgb urine dipstick: NEGATIVE
Ketones, ur: 20 mg/dL — AB
Leukocytes,Ua: NEGATIVE
Nitrite: NEGATIVE
Protein, ur: NEGATIVE mg/dL
Specific Gravity, Urine: 1.011 (ref 1.005–1.030)
Squamous Epithelial / HPF: NONE SEEN (ref 0–5)
pH: 6 (ref 5.0–8.0)

## 2019-10-11 LAB — CBC
HCT: 51.4 % (ref 39.0–52.0)
Hemoglobin: 17.2 g/dL — ABNORMAL HIGH (ref 13.0–17.0)
MCH: 31.4 pg (ref 26.0–34.0)
MCHC: 33.5 g/dL (ref 30.0–36.0)
MCV: 93.8 fL (ref 80.0–100.0)
Platelets: 234 10*3/uL (ref 150–400)
RBC: 5.48 MIL/uL (ref 4.22–5.81)
RDW: 12.8 % (ref 11.5–15.5)
WBC: 7.8 10*3/uL (ref 4.0–10.5)
nRBC: 0 % (ref 0.0–0.2)

## 2019-10-11 MED ORDER — LORAZEPAM 2 MG/ML IJ SOLN
1.0000 mg | Freq: Once | INTRAMUSCULAR | Status: AC
  Administered 2019-10-11: 1 mg via INTRAVENOUS
  Filled 2019-10-11: qty 1

## 2019-10-11 MED ORDER — LORAZEPAM 2 MG/ML IJ SOLN
1.0000 mg | Freq: Once | INTRAMUSCULAR | Status: DC
  Filled 2019-10-11: qty 1

## 2019-10-11 MED ORDER — METOCLOPRAMIDE HCL 5 MG/ML IJ SOLN
10.0000 mg | Freq: Once | INTRAMUSCULAR | Status: AC
  Administered 2019-10-11: 10 mg via INTRAVENOUS
  Filled 2019-10-11: qty 2

## 2019-10-11 MED ORDER — DIPHENHYDRAMINE HCL 50 MG/ML IJ SOLN
12.5000 mg | Freq: Once | INTRAMUSCULAR | Status: AC
  Administered 2019-10-11: 12.5 mg via INTRAVENOUS
  Filled 2019-10-11: qty 1

## 2019-10-11 MED ORDER — MECLIZINE HCL 12.5 MG PO TABS: 12.5000 mg | ORAL_TABLET | Freq: Two times a day (BID) | ORAL | 0 refills | Status: DC | PRN

## 2019-10-11 NOTE — ED Notes (Signed)
Nephew Sophronia Simas updated by phone with patient's permission.  [phone]

## 2019-10-11 NOTE — ED Notes (Signed)
Pt transported to MRI 

## 2019-10-11 NOTE — ED Triage Notes (Signed)
Pt comes POV with dizziness starting this morning. Pt states he gets these episodes sometimes but today was worse and he couldn't stand. Hasn't gone away. Denies any other symptoms. Neurologically intact aside from dizziness. LKW 0600 when he got up.

## 2019-10-11 NOTE — ED Notes (Signed)
Pt states he has been dizzy since this morning.  He states it is worse when he is lying flat on his back and asked that the [facility] be raised.  Pt is alert and oriented and in no acute distress at this time.

## 2019-10-11 NOTE — ED Provider Notes (Signed)
[facility] EMERGENCY DEPARTMENT Provider Note   CSN: [id number] Arrival date & time: 10/11/19  1721     History Chief Complaint  Patient presents with  . Dizziness    Adrian Mitchell is a 79 y.o. male hx of HL, migraines here presenting with dizziness.  Patient states that he woke up at 6 AM and had severe dizziness.  He states every time he lays back, he feels the room is spinning.  He states that he took 2 doses of meclizine with minimal improvement.  He also has some headaches as well.  He states that whenever he walks, he feels unsteady as well.  Denies any trouble speaking or weakness or numbness.  Denies any history of stroke in the past.  The history is provided by the patient.       Past Medical History:  Diagnosis Date  . ED (erectile dysfunction)   . Glaucoma   . Heart murmur   . History of shingles   . HLD (hyperlipidemia)   . Migraine    none for over 3 months  . Postherpetic neuralgia   . Wears dentures    full upper and lower    Patient Active Problem List   Diagnosis Date Noted  . Headache, migraine 06/10/2015  . HZV (herpes zoster virus) post herpetic neuralgia 06/10/2015  . HLD (hyperlipidemia) 04/05/2015  . ED (erectile dysfunction) of organic origin 04/05/2015  . Cephalalgia 12/18/2013    Past Surgical History:  Procedure Laterality Date  . CATARACT EXTRACTION W/PHACO Right 07/23/2019   Procedure: CATARACT EXTRACTION PHACO AND INTRAOCULAR LENS PLACEMENT (IOC) RIGHT 6.92 00:51.1 13.5%;  Surgeon: Leandrew Koyanagi, MD;  Location: [facility];  Service: Ophthalmology;  Laterality: Right;  . COLONOSCOPY  2013  . CYSTOSCOPY  2016  . HERNIA REPAIR     83yrs ago       Family History  Problem Relation Age of Onset  . Prostate cancer Father   . Heart disease Mother     Social History   Tobacco Use  . Smoking status: Never Smoker  . Smokeless tobacco: Never Used  Substance Use Topics  . Alcohol use: No   Alcohol/week: 0.0 standard drinks  . Drug use: No    Home Medications Prior to Admission medications   Medication Sig Start Date End Date Taking? Authorizing Provider  amLODipine (NORVASC) 2.5 MG tablet  07/17/18   [provider]  aspirin 81 MG tablet Take 81 mg by mouth 2 (two) times daily.     [provider]  aspirin-sod bicarb-citric acid (ALKA-SELTZER) 325 MG TBEF tablet Take 325 mg by mouth every 6 (six) hours as needed.    [provider]  butalbital-acetaminophen-caffeine (FIORICET, ESGIC) 50-325-40 MG per tablet Take 1 tablet by mouth 2 (two) times daily as needed for headache.    [provider]  Cyanocobalamin (VITAMIN B-12 PO) Take by mouth daily.    [provider]  dorzolamide-timolol (COSOPT) 22.3-6.8 MG/ML ophthalmic solution Place 1 drop into both eyes 2 (two) times daily.    [provider]  finasteride (PROSCAR) 5 MG tablet Take 1 tablet (5 mg total) by mouth daily. 08/06/18   Billey Co, MD  latanoprost (XALATAN) 0.005 % ophthalmic solution 1 drop nightly 12/16/14   [provider]  simvastatin (ZOCOR) 20 MG tablet Take 20 mg by mouth daily.    [provider]    Allergies    Advicor [niacin-lovastatin er], Niacin, and Penicillins  Review  of Systems   Review of Systems  Neurological: Positive for dizziness.  All other systems reviewed and are negative.   Physical Exam Updated Vital Signs BP 117/61   Pulse 68   Temp 97.7 F (36.5 C) (Oral)   Resp 13   Ht 5\' 6"  (1.676 m)   Wt 70.3 kg   SpO2 95%   BMI 25.02 kg/m   Physical Exam Vitals and nursing note reviewed.  HENT:     Head: Normocephalic.     Nose: Nose normal.     Mouth/Throat:     Mouth: Mucous membranes are moist.  Eyes:     Extraocular Movements: Extraocular movements intact.     Pupils: Pupils are equal, round, and reactive to light.  Cardiovascular:     Rate and Rhythm: Normal rate and regular rhythm.     Pulses:  Normal pulses.     Heart sounds: Normal heart sounds.  Pulmonary:     Effort: Pulmonary effort is normal.     Breath sounds: Normal breath sounds.  Abdominal:     General: Abdomen is flat.  Musculoskeletal:        General: Normal range of motion.     Cervical back: Normal range of motion.  Skin:    General: Skin is warm.     Capillary Refill: Capillary refill takes less than 2 seconds.  Neurological:     Mental Status: He is alert.     Comments: Mild nystagmus to the right.  Patient has normal strength and sensation throughout.  No obvious facial droop.  Patient does have a wide-based gait and does prefer to go the right side but is able to ambulate by himself.    Psychiatric:        Mood and Affect: Mood normal.     ED Results / Procedures / Treatments   Labs (all labs ordered are listed, but only abnormal results are displayed) Labs Reviewed  BASIC METABOLIC PANEL - Abnormal; Notable for the following components:      Result Value   Glucose, Bld 121 (*)    GFR calc non Af Amer 56 (*)    All other components within normal limits  CBC - Abnormal; Notable for the following components:   Hemoglobin 17.2 (*)    All other components within normal limits  URINALYSIS, COMPLETE (UACMP) WITH MICROSCOPIC - Abnormal; Notable for the following components:   Color, Urine YELLOW (*)    APPearance CLEAR (*)    Ketones, ur 20 (*)    All other components within normal limits  CBG MONITORING, ED    EKG EKG Interpretation  Date/Time:  Saturday Oct 11 2019 17:41:09 EDT Ventricular Rate:  74 PR Interval:  196 QRS Duration: 76 QT Interval:  376 QTC Calculation: 417 R Axis:   9 Text Interpretation: Sinus rhythm with Premature atrial complexes Otherwise normal ECG No previous ECGs available Confirmed by Wandra Arthurs [phone]) on 10/11/2019 7:52:05 PM   Radiology MR BRAIN WO CONTRAST  Result Date: 10/11/2019 CLINICAL DATA:  Ataxia, stroke suspected. Dizziness. EXAM: MRI HEAD WITHOUT  CONTRAST TECHNIQUE: Multiplanar, multiecho pulse sequences of the brain and surrounding structures were obtained without intravenous contrast. COMPARISON:  01/25/2017 FINDINGS: Brain: There is no evidence of acute infarct, intracranial hemorrhage, intra-axial mass, midline shift, or extra-axial fluid collection. A 6.2 cm posterior fossa arachnoid cyst is unchanged with mild mass effect again noted on the cerebellum and tentorium. T2 hyperintensities in the cerebral white matter bilaterally are unchanged and  nonspecific but compatible with mild chronic small vessel ischemic disease. Mild cerebral atrophy is within normal limits for age. Vascular: Major intracranial vascular flow voids are preserved. Skull and upper cervical spine: Unremarkable bone marrow signal. Sinuses/Orbits: Bilateral cataract extraction. Paranasal sinuses and mastoid air cells are clear. Other: None. IMPRESSION: 1. No acute intracranial abnormality. 2. Mild chronic small vessel ischemic disease. Electronically Signed   By: Logan Bores M.D.   On: 10/11/2019 22:38    Procedures Procedures (including critical care time)  Medications Ordered in ED Medications  LORazepam (ATIVAN) injection 1 mg (1 mg Intravenous Given 10/11/19 2029)  metoCLOPramide (REGLAN) injection 10 mg (10 mg Intravenous Given 10/11/19 2026)  diphenhydrAMINE (BENADRYL) injection 12.5 mg (12.5 mg Intravenous Given 10/11/19 2023)    ED Course  I have reviewed the triage vital signs and the nursing notes.  Pertinent labs & imaging results that were available during my care of the patient were reviewed by me and considered in my medical decision making (see chart for details).    MDM Rules/Calculators/A&P                      Adrian Mitchell is a 79 y.o. male here presenting with dizziness.  Patient has some mild nystagmus.  He also has some headaches and has a history of migraines.  Consider complex migraines versus peripheral vertigo versus central vertigo.   Will get MRI brain and labs.  Will give Ativan and migraine cocktail and reassess.  10:52 PM Labs unremarkable. MRI brain showed no stroke. Felt better after migraine cocktail and ativan. Will dc home with meclizine and have him follow up with neurology.    Final Clinical Impression(s) / ED Diagnoses Final diagnoses:  None    Rx / DC Orders ED Discharge Orders    None       Drenda Freeze, MD 10/11/19 2253

## 2019-10-11 NOTE — Discharge Instructions (Addendum)
Take meclizine as needed for dizziness   See your doctor   See neurologist for follow up   Return to ER if you have worse dizziness, trouble walking, trouble speaking, weakness

## 2019-10-16 DIAGNOSIS — B0229 Other postherpetic nervous system involvement: Secondary | ICD-10-CM | POA: Diagnosis not present

## 2019-10-16 DIAGNOSIS — G43009 Migraine without aura, not intractable, without status migrainosus: Secondary | ICD-10-CM | POA: Diagnosis not present

## 2019-10-16 DIAGNOSIS — E538 Deficiency of other specified B group vitamins: Secondary | ICD-10-CM | POA: Diagnosis not present

## 2019-10-29 DIAGNOSIS — E538 Deficiency of other specified B group vitamins: Secondary | ICD-10-CM | POA: Diagnosis not present

## 2019-10-29 DIAGNOSIS — G43009 Migraine without aura, not intractable, without status migrainosus: Secondary | ICD-10-CM | POA: Diagnosis not present

## 2019-11-04 DIAGNOSIS — H401123 Primary open-angle glaucoma, left eye, severe stage: Secondary | ICD-10-CM | POA: Diagnosis not present

## 2019-12-04 DIAGNOSIS — I10 Essential (primary) hypertension: Secondary | ICD-10-CM | POA: Diagnosis not present

## 2019-12-04 DIAGNOSIS — E785 Hyperlipidemia, unspecified: Secondary | ICD-10-CM | POA: Diagnosis not present

## 2019-12-11 DIAGNOSIS — Z0000 Encounter for general adult medical examination without abnormal findings: Secondary | ICD-10-CM | POA: Diagnosis not present

## 2019-12-11 DIAGNOSIS — G43109 Migraine with aura, not intractable, without status migrainosus: Secondary | ICD-10-CM | POA: Diagnosis not present

## 2019-12-11 DIAGNOSIS — I499 Cardiac arrhythmia, unspecified: Secondary | ICD-10-CM | POA: Diagnosis not present

## 2019-12-11 DIAGNOSIS — E785 Hyperlipidemia, unspecified: Secondary | ICD-10-CM | POA: Diagnosis not present

## 2020-03-03 DIAGNOSIS — H401123 Primary open-angle glaucoma, left eye, severe stage: Secondary | ICD-10-CM | POA: Diagnosis not present

## 2020-03-11 DIAGNOSIS — H401123 Primary open-angle glaucoma, left eye, severe stage: Secondary | ICD-10-CM | POA: Diagnosis not present

## 2020-03-24 DIAGNOSIS — N401 Enlarged prostate with lower urinary tract symptoms: Secondary | ICD-10-CM | POA: Diagnosis not present

## 2020-03-24 DIAGNOSIS — Z79899 Other long term (current) drug therapy: Secondary | ICD-10-CM | POA: Diagnosis not present

## 2020-03-26 DIAGNOSIS — N401 Enlarged prostate with lower urinary tract symptoms: Secondary | ICD-10-CM | POA: Diagnosis not present

## 2020-03-26 DIAGNOSIS — Z125 Encounter for screening for malignant neoplasm of prostate: Secondary | ICD-10-CM | POA: Diagnosis not present

## 2020-03-30 DIAGNOSIS — G43009 Migraine without aura, not intractable, without status migrainosus: Secondary | ICD-10-CM | POA: Diagnosis not present

## 2020-03-30 DIAGNOSIS — G939 Disorder of brain, unspecified: Secondary | ICD-10-CM | POA: Diagnosis not present

## 2020-03-30 DIAGNOSIS — G930 Cerebral cysts: Secondary | ICD-10-CM | POA: Diagnosis not present

## 2020-05-11 DIAGNOSIS — H35352 Cystoid macular degeneration, left eye: Secondary | ICD-10-CM | POA: Diagnosis not present

## 2020-06-08 DIAGNOSIS — G43719 Chronic migraine without aura, intractable, without status migrainosus: Secondary | ICD-10-CM | POA: Diagnosis not present

## 2020-06-24 DIAGNOSIS — R42 Dizziness and giddiness: Secondary | ICD-10-CM | POA: Diagnosis not present

## 2020-06-24 DIAGNOSIS — R5383 Other fatigue: Secondary | ICD-10-CM | POA: Diagnosis not present

## 2020-06-28 DIAGNOSIS — H35352 Cystoid macular degeneration, left eye: Secondary | ICD-10-CM | POA: Diagnosis not present

## 2020-07-09 DIAGNOSIS — H401112 Primary open-angle glaucoma, right eye, moderate stage: Secondary | ICD-10-CM | POA: Diagnosis not present

## 2020-08-03 DIAGNOSIS — H35352 Cystoid macular degeneration, left eye: Secondary | ICD-10-CM | POA: Diagnosis not present

## 2020-08-10 DIAGNOSIS — H6063 Unspecified chronic otitis externa, bilateral: Secondary | ICD-10-CM | POA: Diagnosis not present

## 2020-08-10 DIAGNOSIS — H6123 Impacted cerumen, bilateral: Secondary | ICD-10-CM | POA: Diagnosis not present

## 2020-09-06 DIAGNOSIS — H35352 Cystoid macular degeneration, left eye: Secondary | ICD-10-CM | POA: Diagnosis not present

## 2020-09-24 DIAGNOSIS — H35352 Cystoid macular degeneration, left eye: Secondary | ICD-10-CM | POA: Diagnosis not present

## 2020-09-28 DIAGNOSIS — H1820 Unspecified corneal edema: Secondary | ICD-10-CM | POA: Diagnosis not present

## 2020-10-04 DIAGNOSIS — H1820 Unspecified corneal edema: Secondary | ICD-10-CM | POA: Diagnosis not present

## 2020-10-05 DIAGNOSIS — R109 Unspecified abdominal pain: Secondary | ICD-10-CM | POA: Diagnosis not present

## 2020-10-05 DIAGNOSIS — R1032 Left lower quadrant pain: Secondary | ICD-10-CM | POA: Diagnosis not present

## 2020-10-05 DIAGNOSIS — E782 Mixed hyperlipidemia: Secondary | ICD-10-CM | POA: Diagnosis not present

## 2020-10-05 DIAGNOSIS — E785 Hyperlipidemia, unspecified: Secondary | ICD-10-CM | POA: Diagnosis not present

## 2020-10-05 DIAGNOSIS — R519 Headache, unspecified: Secondary | ICD-10-CM | POA: Diagnosis not present

## 2020-10-07 ENCOUNTER — Other Ambulatory Visit: Payer: Self-pay | Admitting: Cardiology

## 2020-10-07 DIAGNOSIS — R1032 Left lower quadrant pain: Secondary | ICD-10-CM

## 2020-10-07 DIAGNOSIS — R5383 Other fatigue: Secondary | ICD-10-CM | POA: Diagnosis not present

## 2020-10-11 ENCOUNTER — Ambulatory Visit
Admission: RE | Admit: 2020-10-11 | Discharge: 2020-10-11 | Disposition: A | Discharge status: Home / Self Care | Payer: PPO | Source: Ambulatory Visit | Attending: Cardiology | Admitting: Cardiology

## 2020-10-11 ENCOUNTER — Other Ambulatory Visit: Payer: Self-pay

## 2020-10-11 DIAGNOSIS — K753 Granulomatous hepatitis, not elsewhere classified: Secondary | ICD-10-CM | POA: Diagnosis not present

## 2020-10-11 DIAGNOSIS — R1032 Left lower quadrant pain: Secondary | ICD-10-CM | POA: Insufficient documentation

## 2020-10-11 DIAGNOSIS — I700 Atherosclerosis of aorta: Secondary | ICD-10-CM | POA: Diagnosis not present

## 2020-10-13 DIAGNOSIS — E538 Deficiency of other specified B group vitamins: Secondary | ICD-10-CM | POA: Diagnosis not present

## 2020-10-14 DIAGNOSIS — M545 Low back pain, unspecified: Secondary | ICD-10-CM | POA: Diagnosis not present

## 2020-10-14 DIAGNOSIS — M533 Sacrococcygeal disorders, not elsewhere classified: Secondary | ICD-10-CM | POA: Diagnosis not present

## 2020-11-09 DIAGNOSIS — H1820 Unspecified corneal edema: Secondary | ICD-10-CM | POA: Diagnosis not present

## 2020-11-19 DIAGNOSIS — H401112 Primary open-angle glaucoma, right eye, moderate stage: Secondary | ICD-10-CM | POA: Diagnosis not present

## 2020-12-08 DIAGNOSIS — Z0000 Encounter for general adult medical examination without abnormal findings: Secondary | ICD-10-CM | POA: Diagnosis not present

## 2020-12-08 DIAGNOSIS — Z125 Encounter for screening for malignant neoplasm of prostate: Secondary | ICD-10-CM | POA: Diagnosis not present

## 2020-12-08 DIAGNOSIS — E785 Hyperlipidemia, unspecified: Secondary | ICD-10-CM | POA: Diagnosis not present

## 2020-12-14 DIAGNOSIS — E785 Hyperlipidemia, unspecified: Secondary | ICD-10-CM | POA: Diagnosis not present

## 2020-12-14 DIAGNOSIS — G44229 Chronic tension-type headache, not intractable: Secondary | ICD-10-CM | POA: Diagnosis not present

## 2020-12-14 DIAGNOSIS — Z0000 Encounter for general adult medical examination without abnormal findings: Secondary | ICD-10-CM | POA: Diagnosis not present

## 2020-12-14 DIAGNOSIS — H539 Unspecified visual disturbance: Secondary | ICD-10-CM | POA: Diagnosis not present

## 2020-12-22 DIAGNOSIS — R42 Dizziness and giddiness: Secondary | ICD-10-CM | POA: Diagnosis not present

## 2020-12-22 DIAGNOSIS — E785 Hyperlipidemia, unspecified: Secondary | ICD-10-CM | POA: Diagnosis not present

## 2021-01-06 DIAGNOSIS — H18232 Secondary corneal edema, left eye: Secondary | ICD-10-CM | POA: Diagnosis not present

## 2021-01-27 DIAGNOSIS — G43719 Chronic migraine without aura, intractable, without status migrainosus: Secondary | ICD-10-CM | POA: Diagnosis not present

## 2021-01-27 DIAGNOSIS — L989 Disorder of the skin and subcutaneous tissue, unspecified: Secondary | ICD-10-CM | POA: Diagnosis not present

## 2021-02-04 DIAGNOSIS — M5489 Other dorsalgia: Secondary | ICD-10-CM | POA: Diagnosis not present

## 2021-02-04 DIAGNOSIS — R351 Nocturia: Secondary | ICD-10-CM | POA: Diagnosis not present

## 2021-02-04 DIAGNOSIS — R350 Frequency of micturition: Secondary | ICD-10-CM | POA: Diagnosis not present

## 2021-02-04 DIAGNOSIS — N401 Enlarged prostate with lower urinary tract symptoms: Secondary | ICD-10-CM | POA: Diagnosis not present

## 2021-02-07 DIAGNOSIS — L821 Other seborrheic keratosis: Secondary | ICD-10-CM | POA: Diagnosis not present

## 2021-02-07 DIAGNOSIS — D485 Neoplasm of uncertain behavior of skin: Secondary | ICD-10-CM | POA: Diagnosis not present

## 2021-02-07 DIAGNOSIS — L708 Other acne: Secondary | ICD-10-CM | POA: Diagnosis not present

## 2021-02-07 DIAGNOSIS — L718 Other rosacea: Secondary | ICD-10-CM | POA: Diagnosis not present

## 2021-02-22 DIAGNOSIS — M545 Low back pain, unspecified: Secondary | ICD-10-CM | POA: Diagnosis not present

## 2021-02-22 DIAGNOSIS — G8929 Other chronic pain: Secondary | ICD-10-CM | POA: Diagnosis not present

## 2021-02-22 DIAGNOSIS — R5383 Other fatigue: Secondary | ICD-10-CM | POA: Diagnosis not present

## 2021-02-22 DIAGNOSIS — R5381 Other malaise: Secondary | ICD-10-CM | POA: Diagnosis not present

## 2021-03-28 DIAGNOSIS — Z79899 Other long term (current) drug therapy: Secondary | ICD-10-CM | POA: Diagnosis not present

## 2021-03-28 DIAGNOSIS — R399 Unspecified symptoms and signs involving the genitourinary system: Secondary | ICD-10-CM | POA: Diagnosis not present

## 2021-03-28 DIAGNOSIS — N401 Enlarged prostate with lower urinary tract symptoms: Secondary | ICD-10-CM | POA: Diagnosis not present

## 2021-03-28 DIAGNOSIS — R351 Nocturia: Secondary | ICD-10-CM | POA: Diagnosis not present

## 2021-03-29 DIAGNOSIS — Z125 Encounter for screening for malignant neoplasm of prostate: Secondary | ICD-10-CM | POA: Diagnosis not present

## 2021-03-29 DIAGNOSIS — N401 Enlarged prostate with lower urinary tract symptoms: Secondary | ICD-10-CM | POA: Diagnosis not present

## 2021-03-31 DIAGNOSIS — G43719 Chronic migraine without aura, intractable, without status migrainosus: Secondary | ICD-10-CM | POA: Diagnosis not present

## 2021-03-31 DIAGNOSIS — G939 Disorder of brain, unspecified: Secondary | ICD-10-CM | POA: Diagnosis not present

## 2021-03-31 DIAGNOSIS — L989 Disorder of the skin and subcutaneous tissue, unspecified: Secondary | ICD-10-CM | POA: Diagnosis not present

## 2021-04-08 DIAGNOSIS — H401112 Primary open-angle glaucoma, right eye, moderate stage: Secondary | ICD-10-CM | POA: Diagnosis not present

## 2021-04-26 DIAGNOSIS — E785 Hyperlipidemia, unspecified: Secondary | ICD-10-CM | POA: Diagnosis not present

## 2021-04-26 DIAGNOSIS — H1820 Unspecified corneal edema: Secondary | ICD-10-CM | POA: Diagnosis not present

## 2021-04-26 DIAGNOSIS — Z7982 Long term (current) use of aspirin: Secondary | ICD-10-CM | POA: Diagnosis not present

## 2021-04-26 DIAGNOSIS — I10 Essential (primary) hypertension: Secondary | ICD-10-CM | POA: Diagnosis not present

## 2021-04-26 DIAGNOSIS — H18232 Secondary corneal edema, left eye: Secondary | ICD-10-CM | POA: Diagnosis not present

## 2021-04-26 DIAGNOSIS — Z880 Allergy status to penicillin: Secondary | ICD-10-CM | POA: Diagnosis not present

## 2021-05-02 ENCOUNTER — Emergency Department
Admission: EM | Admit: 2021-05-02 | Discharge: 2021-05-02 | Disposition: A | Discharge status: Home / Self Care | Payer: PPO | Attending: Emergency Medicine | Admitting: Emergency Medicine

## 2021-05-02 ENCOUNTER — Encounter: Payer: Self-pay | Admitting: Radiology

## 2021-05-02 ENCOUNTER — Other Ambulatory Visit: Payer: Self-pay

## 2021-05-02 ENCOUNTER — Emergency Department: Payer: PPO

## 2021-05-02 DIAGNOSIS — D72829 Elevated white blood cell count, unspecified: Secondary | ICD-10-CM | POA: Insufficient documentation

## 2021-05-02 DIAGNOSIS — J189 Pneumonia, unspecified organism: Secondary | ICD-10-CM | POA: Diagnosis not present

## 2021-05-02 DIAGNOSIS — R1010 Upper abdominal pain, unspecified: Secondary | ICD-10-CM | POA: Insufficient documentation

## 2021-05-02 DIAGNOSIS — R059 Cough, unspecified: Secondary | ICD-10-CM | POA: Diagnosis present

## 2021-05-02 DIAGNOSIS — R918 Other nonspecific abnormal finding of lung field: Secondary | ICD-10-CM | POA: Diagnosis not present

## 2021-05-02 DIAGNOSIS — J984 Other disorders of lung: Secondary | ICD-10-CM | POA: Diagnosis not present

## 2021-05-02 DIAGNOSIS — R9431 Abnormal electrocardiogram [ECG] [EKG]: Secondary | ICD-10-CM | POA: Diagnosis not present

## 2021-05-02 LAB — COMPREHENSIVE METABOLIC PANEL
ALT: 17 U/L (ref 0–44)
AST: 18 U/L (ref 15–41)
Albumin: 3.9 g/dL (ref 3.5–5.0)
Alkaline Phosphatase: 165 U/L — ABNORMAL HIGH (ref 38–126)
Anion gap: 6 (ref 5–15)
BUN: 15 mg/dL (ref 8–23)
CO2: 28 mmol/L (ref 22–32)
Calcium: 9.3 mg/dL (ref 8.9–10.3)
Chloride: 106 mmol/L (ref 98–111)
Creatinine, Ser: 1.08 mg/dL (ref 0.61–1.24)
GFR, Estimated: >60 mL/min (ref >60)
Glucose, Bld: 118 mg/dL — ABNORMAL HIGH (ref 70–99)
Potassium: 4.2 mmol/L (ref 3.5–5.1)
Sodium: 140 mmol/L (ref 135–145)
Total Bilirubin: 1.3 mg/dL — ABNORMAL HIGH (ref 0.3–1.2)
Total Protein: 6.8 g/dL (ref 6.5–8.1)

## 2021-05-02 LAB — TROPONIN I (HIGH SENSITIVITY)
Troponin I (High Sensitivity): 5 ng/L (ref <18)
Troponin I (High Sensitivity): 4 ng/L (ref <18)

## 2021-05-02 LAB — CBC
HCT: 49.1 % (ref 39.0–52.0)
Hemoglobin: 16.5 g/dL (ref 13.0–17.0)
MCH: 31.4 pg (ref 26.0–34.0)
MCHC: 33.6 g/dL (ref 30.0–36.0)
MCV: 93.5 fL (ref 80.0–100.0)
Platelets: 225 10*3/uL (ref 150–400)
RBC: 5.25 MIL/uL (ref 4.22–5.81)
RDW: 13.1 % (ref 11.5–15.5)
WBC: 14 10*3/uL — ABNORMAL HIGH (ref 4.0–10.5)
nRBC: 0 % (ref 0.0–0.2)

## 2021-05-02 LAB — LIPASE, BLOOD: Lipase: 27 U/L (ref 11–51)

## 2021-05-02 MED ORDER — CEFPODOXIME PROXETIL 200 MG PO TABS: 200.0000 mg | ORAL_TABLET | Freq: Two times a day (BID) | ORAL | 0 refills | Status: AC

## 2021-05-02 MED ORDER — AZITHROMYCIN 250 MG PO TABS: ORAL_TABLET | ORAL | 0 refills | Status: AC

## 2021-05-02 NOTE — Discharge Instructions (Signed)
Please follow-up with your primary care doctor, return the emergency department if any worsening symptoms

## 2021-05-02 NOTE — ED Triage Notes (Signed)
Pt states since Thursday has had upper abd pain. Pt denies vomiting, diarrhea. Pt states he feels like "I have been burning up". Pt recently had cornea implant surgery. Pt appears in no acute distress.

## 2021-05-02 NOTE — ED Provider Notes (Signed)
[facility] Emergency Department Provider Note   ____________________________________________    I have reviewed the triage vital signs and the nursing notes.   HISTORY  Chief Complaint Abdominal Pain     HPI Adrian Mitchell is a 80 y.o. male who presents with complaints of feeling ill, fatigued, occasional cough, fevers especially overnight has had intermittent abdominal discomfort as well as upper abdomen.  No vomiting.  Recently had corneal surgery and he wonders if the sentiment to do with it.  No new medications reported.  Normal stools  Past Medical History:  Diagnosis Date   ED (erectile dysfunction)    Glaucoma    Heart murmur    History of shingles    HLD (hyperlipidemia)    Migraine    none for over 3 months   Postherpetic neuralgia    Wears dentures    full upper and lower    Patient Active Problem List   Diagnosis Date Noted   Headache, migraine 06/10/2015   HZV (herpes zoster virus) post herpetic neuralgia 06/10/2015   HLD (hyperlipidemia) 04/05/2015   ED (erectile dysfunction) of organic origin 04/05/2015   Cephalalgia 12/18/2013    Past Surgical History:  Procedure Laterality Date   CATARACT EXTRACTION W/PHACO Right 07/23/2019   Procedure: CATARACT EXTRACTION PHACO AND INTRAOCULAR LENS PLACEMENT (IOC) RIGHT 6.92 00:51.1 13.5%;  Surgeon: Leandrew Koyanagi, MD;  Location: [place];  Service: Ophthalmology;  Laterality: Right;   COLONOSCOPY  2013   CYSTOSCOPY  2016   HERNIA REPAIR     72yrs ago    Prior to Admission medications   Medication Sig Start Date End Date Taking? Authorizing Provider  azithromycin (ZITHROMAX Z-PAK) 250 MG tablet Take 2 tablets (500 mg) on  Day 1,  followed by 1 tablet (250 mg) once daily on Days 2 through 5. 05/02/21 05/07/21 Yes Lavonia Drafts, MD  cefpodoxime (VANTIN) 200 MG tablet Take 1 tablet (200 mg total) by mouth 2 (two) times daily for 7 days. 05/02/21 05/09/21 Yes Lavonia Drafts, MD  amLODipine (NORVASC) 2.5 MG tablet  07/17/18   [provider]  aspirin 81 MG tablet Take 81 mg by mouth 2 (two) times daily.     [provider]  aspirin-sod bicarb-citric acid (ALKA-SELTZER) 325 MG TBEF tablet Take 325 mg by mouth every 6 (six) hours as needed.    [provider]  butalbital-acetaminophen-caffeine (FIORICET, ESGIC) 50-325-40 MG per tablet Take 1 tablet by mouth 2 (two) times daily as needed for headache.    [provider]  Cyanocobalamin (VITAMIN B-12 PO) Take by mouth daily.    [provider]  dorzolamide-timolol (COSOPT) 22.3-6.8 MG/ML ophthalmic solution Place 1 drop into both eyes 2 (two) times daily.    [provider]  finasteride (PROSCAR) 5 MG tablet Take 1 tablet (5 mg total) by mouth daily. 08/06/18   Billey Co, MD  latanoprost (XALATAN) 0.005 % ophthalmic solution 1 drop nightly 12/16/14   [provider]  meclizine (ANTIVERT) 12.5 MG tablet Take 1 tablet (12.5 mg total) by mouth 2 (two) times daily as needed for dizziness. 10/11/19   Drenda Freeze, MD  simvastatin (ZOCOR) 20 MG tablet Take 20 mg by mouth daily.    [provider]     Allergies Advicor [niacin-lovastatin er], Niacin, and Penicillins  Family History  Problem Relation Age of Onset   Prostate cancer Father    Heart disease Mother     Social History Social  History   Tobacco Use   Smoking status: Never   Smokeless tobacco: Never  Vaping Use   Vaping Use: Never used  Substance Use Topics   Alcohol use: No    Alcohol/week: 0.0 standard drinks   Drug use: No    Review of Systems  Constitutional: As above Eyes: As above ENT: No sore throat. Cardiovascular: Denies chest pain. Respiratory: Denies shortness of breath. Gastrointestinal: As above Genitourinary: Negative for dysuria. Musculoskeletal: Negative for back pain. Skin: Negative for rash. Neurological: Negative for headaches or  weakness   ____________________________________________   PHYSICAL EXAM:  VITAL SIGNS: ED Triage Vitals  Enc Vitals Group     BP 05/02/21 0442 137/83     Pulse Rate 05/02/21 0442 94     Resp 05/02/21 0442 16     Temp 05/02/21 0442 (!) 97.5 F (36.4 C)     Temp Source 05/02/21 0442 Oral     SpO2 05/02/21 0442 96 %     Weight 05/02/21 0443 74.8 kg (165 lb)     Height 05/02/21 0443 1.702 m (5\' 7" )     Head Circumference --      Peak Flow --      Pain Score 05/02/21 0443 5     Pain Loc --      Pain Edu? --      Excl. in [place]? --     Constitutional: Alert and oriented.  Eyes: Conjunctivae are normal.   Nose: No congestion/rhinnorhea. Mouth/Throat: Mucous membranes are moist.    Cardiovascular: Normal rate, regular rhythm. Grossly normal heart sounds.  Good peripheral circulation. Respiratory: Normal respiratory effort.  No retractions. Lungs CTAB. Gastrointestinal: Soft and nontender. No distention.  Reassuring exam  Musculoskeletal: No lower extremity tenderness nor edema.  Warm and well perfused Neurologic:  Normal speech and language. No gross focal neurologic deficits are appreciated.  Skin:  Skin is warm, dry and intact. No rash noted. Psychiatric: Mood and affect are normal. Speech and behavior are normal.  ____________________________________________   LABS (all labs ordered are listed, but only abnormal results are displayed)  Labs Reviewed  CBC - Abnormal; Notable for the following components:      Result Value   WBC 14.0 (*)    All other components within normal limits  COMPREHENSIVE METABOLIC PANEL - Abnormal; Notable for the following components:   Glucose, Bld 118 (*)    Alkaline Phosphatase 165 (*)    Total Bilirubin 1.3 (*)    All other components within normal limits  LIPASE, BLOOD  TROPONIN I (HIGH SENSITIVITY)  TROPONIN I (HIGH SENSITIVITY)   ____________________________________________  EKG  ED ECG REPORT I, Lavonia Drafts, the attending  physician, personally viewed and interpreted this ECG.  Date: 05/02/2021  Rhythm: normal sinus rhythm QRS Axis: normal Intervals: normal ST/T Wave abnormalities: normal Narrative Interpretation: no evidence of acute ischemia  ____________________________________________  RADIOLOGY  Chest x-ray reviewed by me possible nodular pneumonia ____________________________________________   PROCEDURES  Procedure(s) performed: No  Procedures   Critical Care performed: No ____________________________________________   INITIAL IMPRESSION / ASSESSMENT AND PLAN / ED COURSE  Pertinent labs & imaging results that were available during my care of the patient were reviewed by me and considered in my medical decision making (see chart for details).   Patient reports he is having nighttime fevers, feels fatigued and weak occasional cough, white blood cell count is elevated, lab work is otherwise reassuring  Chest x-ray demonstrates multiple nodular areas suspicious for pneumonia especially given  his HPI elevated white blood cell count.  Will treat with antibiotics, outpatient follow-up recommended, return precautions discussed    ____________________________________________   FINAL CLINICAL IMPRESSION(S) / ED DIAGNOSES  Final diagnoses:  Community acquired pneumonia, unspecified laterality        Note:  This document was prepared using Dragon voice recognition software and may include unintentional dictation errors.    Lavonia Drafts, MD 05/02/21 [phone]

## 2021-05-09 ENCOUNTER — Ambulatory Visit: Payer: PPO | Admitting: Dermatology

## 2021-05-09 DIAGNOSIS — R5381 Other malaise: Secondary | ICD-10-CM | POA: Diagnosis not present

## 2021-05-09 DIAGNOSIS — R5383 Other fatigue: Secondary | ICD-10-CM | POA: Diagnosis not present

## 2021-05-09 DIAGNOSIS — Z03818 Encounter for observation for suspected exposure to other biological agents ruled out: Secondary | ICD-10-CM | POA: Diagnosis not present

## 2021-05-09 DIAGNOSIS — J189 Pneumonia, unspecified organism: Secondary | ICD-10-CM | POA: Diagnosis not present

## 2021-05-09 DIAGNOSIS — R509 Fever, unspecified: Secondary | ICD-10-CM | POA: Diagnosis not present

## 2021-05-17 DIAGNOSIS — R911 Solitary pulmonary nodule: Secondary | ICD-10-CM | POA: Diagnosis not present

## 2021-05-17 DIAGNOSIS — Z8701 Personal history of pneumonia (recurrent): Secondary | ICD-10-CM | POA: Diagnosis not present

## 2021-05-19 ENCOUNTER — Other Ambulatory Visit: Payer: Self-pay | Admitting: Family Medicine

## 2021-05-19 DIAGNOSIS — R911 Solitary pulmonary nodule: Secondary | ICD-10-CM

## 2021-06-13 ENCOUNTER — Other Ambulatory Visit: Payer: Self-pay

## 2021-06-13 ENCOUNTER — Ambulatory Visit
Admission: RE | Admit: 2021-06-13 | Discharge: 2021-06-13 | Disposition: A | Discharge status: Home / Self Care | Payer: PPO | Source: Ambulatory Visit | Attending: Family Medicine | Admitting: Family Medicine

## 2021-06-13 DIAGNOSIS — R911 Solitary pulmonary nodule: Secondary | ICD-10-CM | POA: Insufficient documentation

## 2021-06-13 DIAGNOSIS — J479 Bronchiectasis, uncomplicated: Secondary | ICD-10-CM | POA: Diagnosis not present

## 2021-06-13 DIAGNOSIS — I700 Atherosclerosis of aorta: Secondary | ICD-10-CM | POA: Diagnosis not present

## 2021-06-15 DIAGNOSIS — I10 Essential (primary) hypertension: Secondary | ICD-10-CM | POA: Diagnosis not present

## 2021-06-15 DIAGNOSIS — E782 Mixed hyperlipidemia: Secondary | ICD-10-CM | POA: Diagnosis not present

## 2021-06-22 DIAGNOSIS — H539 Unspecified visual disturbance: Secondary | ICD-10-CM | POA: Diagnosis not present

## 2021-06-22 DIAGNOSIS — Z125 Encounter for screening for malignant neoplasm of prostate: Secondary | ICD-10-CM | POA: Diagnosis not present

## 2021-06-22 DIAGNOSIS — I10 Essential (primary) hypertension: Secondary | ICD-10-CM | POA: Diagnosis not present

## 2021-06-22 DIAGNOSIS — R42 Dizziness and giddiness: Secondary | ICD-10-CM | POA: Diagnosis not present

## 2021-06-22 DIAGNOSIS — R9389 Abnormal findings on diagnostic imaging of other specified body structures: Secondary | ICD-10-CM | POA: Diagnosis not present

## 2021-06-22 IMAGING — MR MR HEAD W/O CM
11 series · 42 of 48 positions shown · non-contrast
Comparison: 01/25/2017

CLINICAL DATA: Ataxia, stroke suspected. Dizziness.

EXAM:
MRI HEAD WITHOUT CONTRAST
TECHNIQUE: Multiplanar, multiecho pulse sequences of the brain and surrounding
structures were obtained without intravenous contrast.

[Series 5: ax dwi_tracew · axial · 3.0mm · 0.60mm/px · z∈[-127,+28]mm · 5 of 48 slices shown]
[im 1/48]
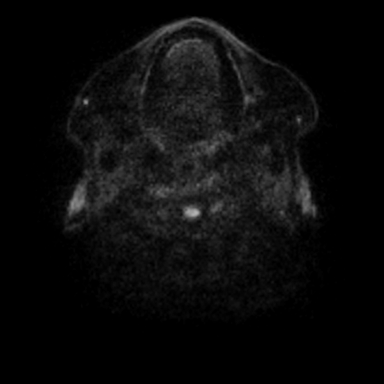
[im 12/48]
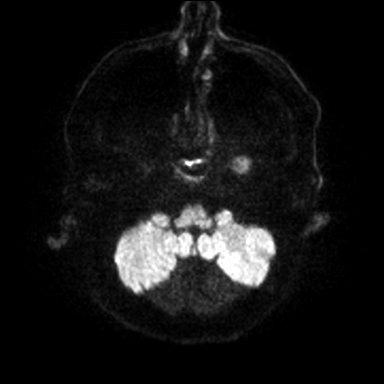
[im 24/48]
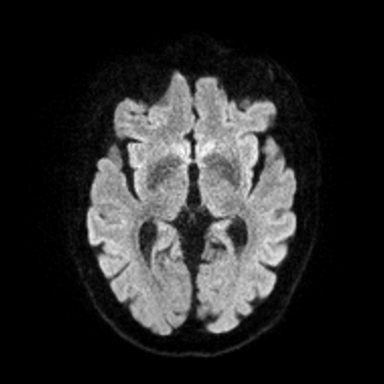
[im 36/48]
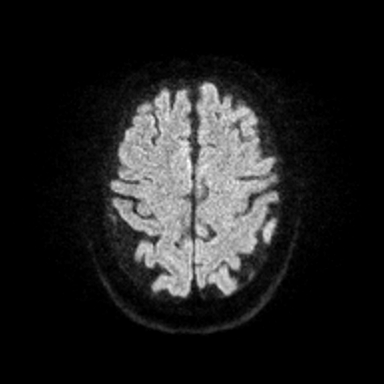
[im 48/48]
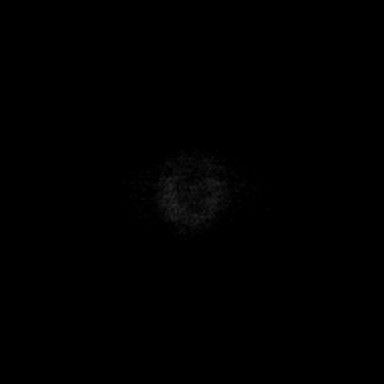

[Series 6: ax dwi_adc · axial · 3.0mm · 0.60mm/px · z∈[-127,+18]mm · 5 of 45 slices shown]
[im 1/45]
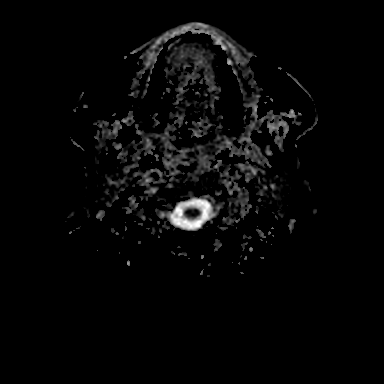
[im 12/45]
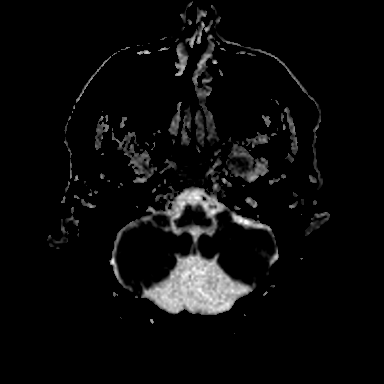
[im 23/45]
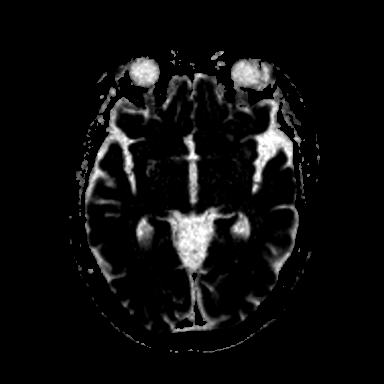
[im 34/45]
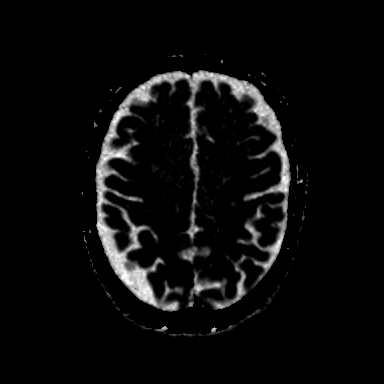
[im 45/45]
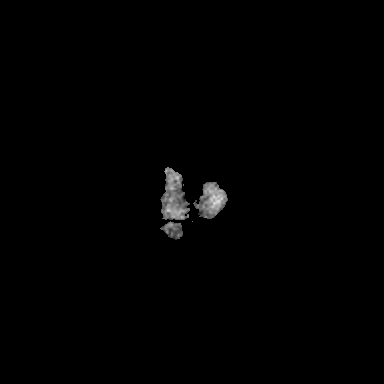

[Series 7: cor dwi_tracew · coronal · 5.0mm · 0.60mm/px · 3 of 36 slices shown]
[im 1/36]
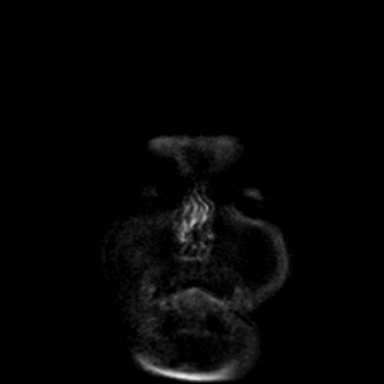
[im 18/36]
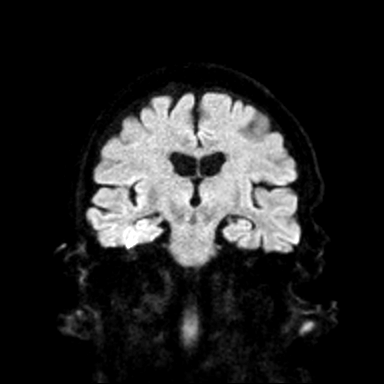
[im 36/36]
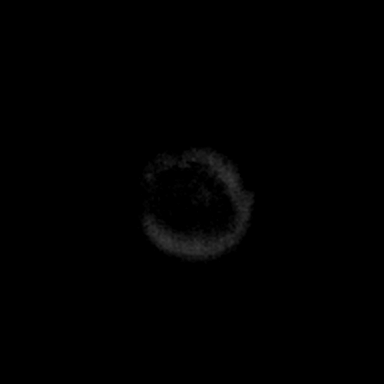

[Series 8: cor dwi_adc · coronal · 5.0mm · 0.60mm/px · 3 of 36 slices shown]
[im 1/36]
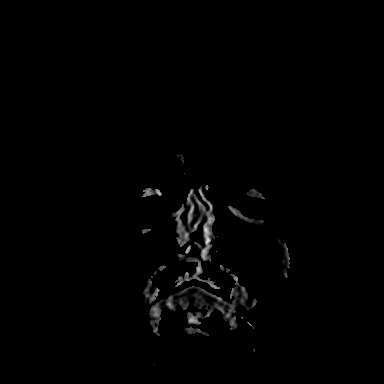
[im 18/36]
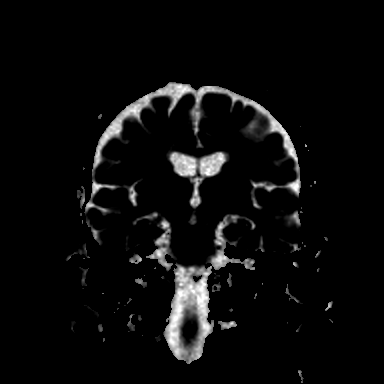
[im 36/36]
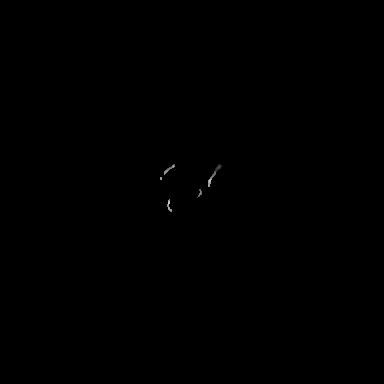

[Series 9: T1 · sagittal · 5.0mm · 0.62mm/px · 2 of 21 slices shown (1 of 2)]
[im 1/21]
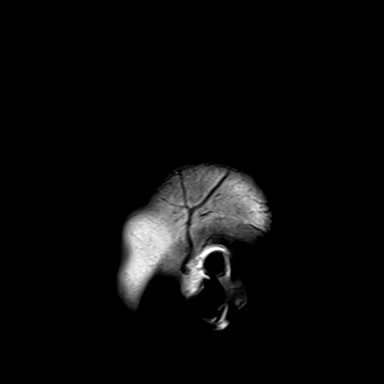
[im 21/21]
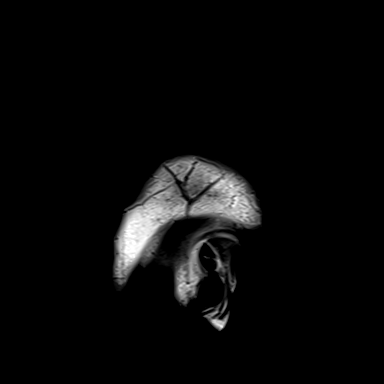

[Series 10: T2 · axial · 5.0mm · 0.53mm/px · z∈[-121,+23]mm · 2 of 25 slices shown (1 of 2)]
[im 1/25]
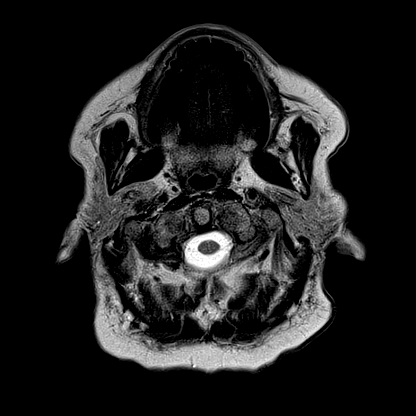
[im 25/25]
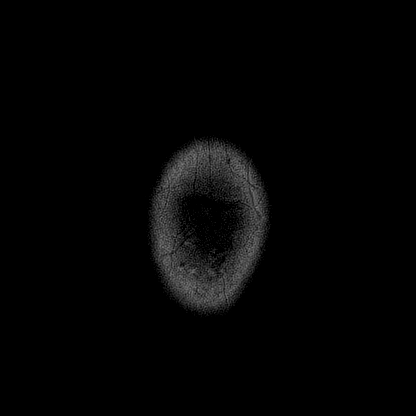

[Series 12: pha_images · axial · 3.0mm · 0.90mm/px · z∈[-137,+40]mm · 5 of 60 slices shown]
[im 1/60]
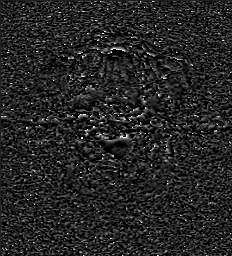
[im 15/60]
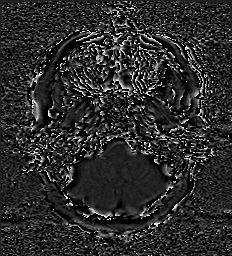
[im 30/60]
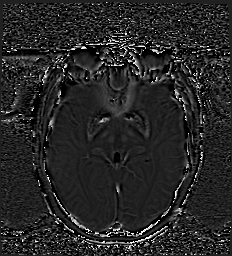
[im 45/60]
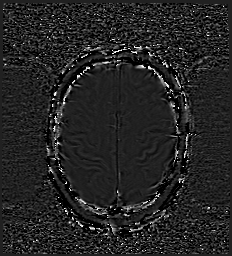
[im 60/60]
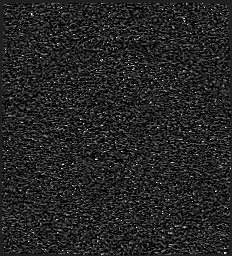

[Series 13: swi_images · axial · 3.0mm · 0.90mm/px · z∈[-137,-50]mm · 3 of 60 slices shown]
[im 1/60]
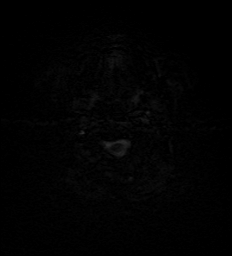
[im 15/60]
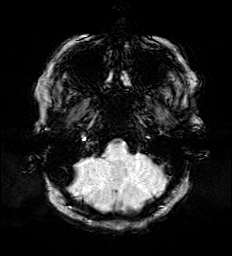
[im 30/60]
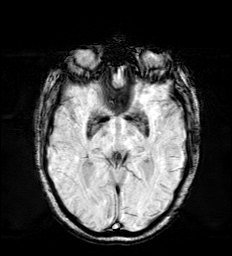

[Series 15: FLAIR · axial · 3.0mm · 0.53mm/px · z∈[-124,+26]mm · 4 of 51 slices shown]
[im 1/51]
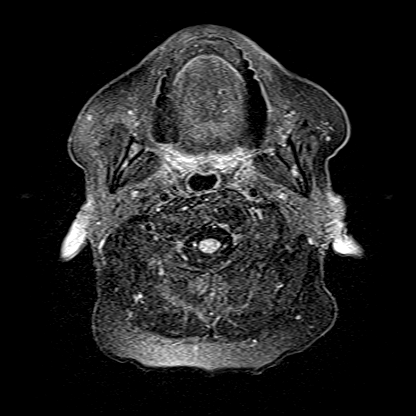
[im 17/51]
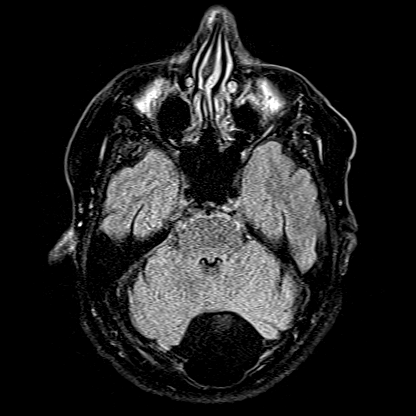
[im 34/51]
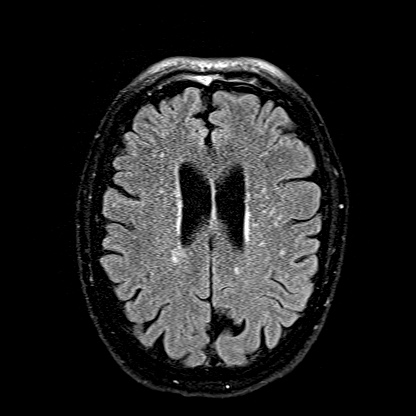
[im 51/51]
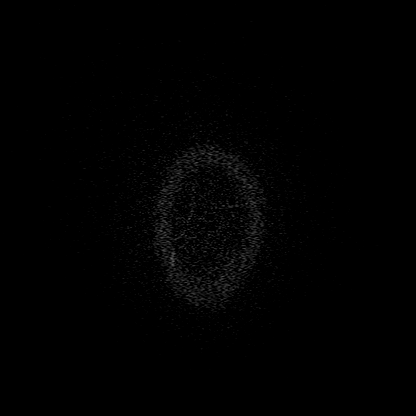

[Series 16: T1 · axial · 1.0mm · 0.98mm/px · z∈[-118,+25]mm · 8 of 144 slices shown (2 of 2)]
[im 1/144]
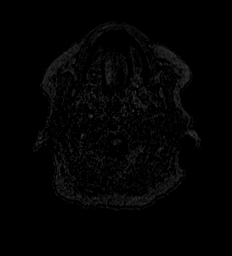
[im 27/144]
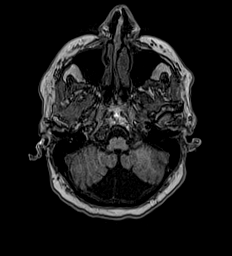
[im 40/144]
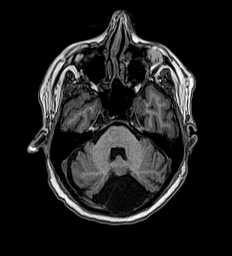
[im 66/144]
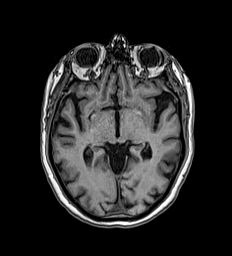
[im 79/144]
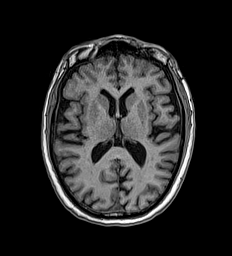
[im 105/144]
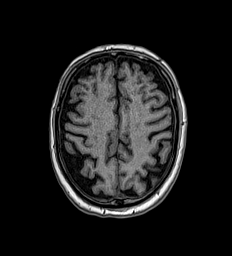
[im 118/144]
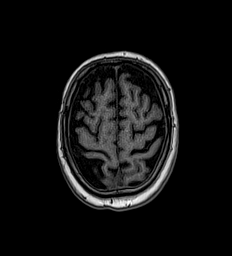
[im 144/144]
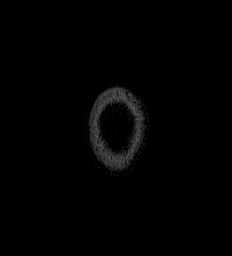

[Series 17: T2 · coronal · 5.0mm · 0.57mm/px · 2 of 27 slices shown (2 of 2)]
[im 1/27]
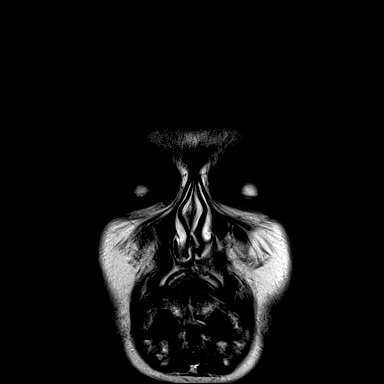
[im 27/27]
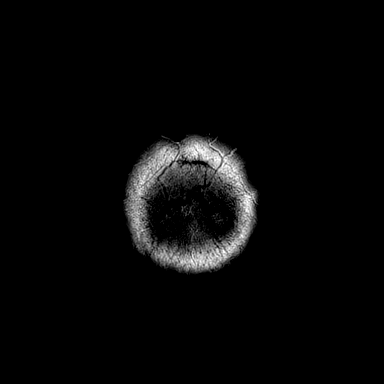

[42 of 48 positions shown; findings below may reference images not displayed]

FINDINGS: Brain: There is no evidence of acute infarct, intracranial
hemorrhage, intra-axial mass, midline shift, or extra-axial fluid
collection. A 6.2 cm posterior fossa arachnoid cyst is unchanged
with mild mass effect again noted on the cerebellum and tentorium.
T2 hyperintensities in the cerebral white matter bilaterally are
unchanged and nonspecific but compatible with mild chronic small
vessel ischemic disease. Mild cerebral atrophy is within normal
limits for age.

Vascular: Major intracranial vascular flow voids are preserved.

Skull and upper cervical spine: Unremarkable bone marrow signal.

Sinuses/Orbits: Bilateral cataract extraction. Paranasal sinuses and
mastoid air cells are clear.

Other: None.
IMPRESSION: 1. No acute intracranial abnormality.
2. Mild chronic small vessel ischemic disease.

## 2021-06-24 DIAGNOSIS — R918 Other nonspecific abnormal finding of lung field: Secondary | ICD-10-CM | POA: Diagnosis not present

## 2021-06-30 DIAGNOSIS — G43719 Chronic migraine without aura, intractable, without status migrainosus: Secondary | ICD-10-CM | POA: Diagnosis not present

## 2021-06-30 DIAGNOSIS — G939 Disorder of brain, unspecified: Secondary | ICD-10-CM | POA: Diagnosis not present

## 2021-06-30 DIAGNOSIS — R5381 Other malaise: Secondary | ICD-10-CM | POA: Diagnosis not present

## 2021-07-06 DIAGNOSIS — Z961 Presence of intraocular lens: Secondary | ICD-10-CM | POA: Diagnosis not present

## 2021-07-06 DIAGNOSIS — H401423 Capsular glaucoma with pseudoexfoliation of lens, left eye, severe stage: Secondary | ICD-10-CM | POA: Diagnosis not present

## 2021-07-06 DIAGNOSIS — H401412 Capsular glaucoma with pseudoexfoliation of lens, right eye, moderate stage: Secondary | ICD-10-CM | POA: Diagnosis not present

## 2021-08-02 NOTE — Progress Notes (Signed)
? ?08/03/21 ? ?Adrian Mitchell ?23-Dec-1940 ?[id number] ? ?Referring provider:  ?Maryland Pink, MD ?[facility] ?[facility] ?[place],  [place] [address] ?Chief Complaint  ?Patient presents with  ? Urinary Frequency  ? ? ? ?HPI: ?Adrian Mitchell is a 81 y.o.male with a personal history of BPH with LUTS and nocturia, who presents today for further evaluation of urinary frequency that is worse at night.  ? ?He was seen on 08/06/2018 in clinic by Dr. Diamantina Providence. He has a long history of urinary symptoms and he has been well managed on finasteride.  ? ?He was previously on Flomax in the past but he did not tolerate this due to dizziness.  He has a distant history of cystoscopy with Dr. Yves Dill, which reportedly showed prostatic enlargement but no stricture or other pathology. ? ? His most recent PSA was 0.6 on 11/2020 ? ?He was seen by Dr Yves Dill on 03/28/2021. He can't see him any longer due to insurance issues.   ? ?He reports that his urinary issues are worse during the nighttime. He denies snoring during night. He denies and blood in urine or swelling in legs.  ? ? IPSS   ? ? [place] Name 08/03/21 1000  ?  ?  ?  ? International Prostate Symptom Score  ? How often have you had the sensation of not emptying your bladder? Less than half the time    ? How often have you had to urinate less than every two hours? Less than half the time    ? How often have you found you stopped and started again several times when you urinated? About half the time    ? How often have you found it difficult to postpone urination? Not at All    ? How often have you had a weak urinary stream? About half the time    ? How often have you had to strain to start urination? About half the time    ? How many times did you typically get up at night to urinate? 2 Times    ? Total IPSS Score 15    ?  ? Quality of Life due to urinary symptoms  ? If you were to spend the rest of your life with your urinary condition just the way it is now how would you feel  about that? Mixed    ? ?  ?  ? ?  ? ? ?Score:  ?1-7 Mild ?8-19 Moderate ?20-35 Severe ? ? ? ? ?PMH: ?Past Medical History:  ?Diagnosis Date  ? ED (erectile dysfunction)   ? Glaucoma   ? Heart murmur   ? History of shingles   ? HLD (hyperlipidemia)   ? Migraine   ? none for over 3 months  ? Postherpetic neuralgia   ? Wears dentures   ? full upper and lower  ? ? ?Surgical History: ?Past Surgical History:  ?Procedure Laterality Date  ? CATARACT EXTRACTION W/PHACO Right 07/23/2019  ? Procedure: CATARACT EXTRACTION PHACO AND INTRAOCULAR LENS PLACEMENT (IOC) RIGHT 6.92 00:51.1 13.5%;  Surgeon: Leandrew Koyanagi, MD;  Location: [place] [facility];  Service: Ophthalmology;  Laterality: Right;  ? COLONOSCOPY  2013  ? CYSTOSCOPY  2016  ? HERNIA REPAIR    ? 40yr ago  ? ? ?Home Medications:  ?Allergies as of 08/03/2021   ? ?   Reactions  ? Topiramate Other (See Comments)  ? Orthostatic BP/Low blood pressure  ?Orthostatic BP/Low blood pressure   ? Advicor [  niacin-lovastatin Er]   ? Niacin   ? Penicillins Rash  ? ?  ? ?  ?Medication List  ?  ? ?  ? Accurate as of August 03, 2021 11:59 PM. If you have any questions, ask your nurse or doctor.  ?  ?  ? ?  ? ?amLODipine 2.5 MG tablet ?Commonly known as: NORVASC ?  ?aspirin 81 MG tablet ?Take 81 mg by mouth 2 (two) times daily. ?  ?aspirin-sod bicarb-citric acid 325 MG Tbef tablet ?Commonly known as: ALKA-SELTZER ?Take 325 mg by mouth every 6 (six) hours as needed. ?  ?brimonidine 0.2 % ophthalmic solution ?Commonly known as: ALPHAGAN ?SMARTSIG:In Eye(s) ?  ?butalbital-acetaminophen-caffeine 50-325-40 MG tablet ?Commonly known as: FIORICET ?Take 1 tablet by mouth 2 (two) times daily as needed for headache. ?  ?dorzolamide-timolol 22.3-6.8 MG/ML ophthalmic solution ?Commonly known as: COSOPT ?Place 1 drop into both eyes 2 (two) times daily. ?  ?finasteride 5 MG tablet ?Commonly known as: PROSCAR ?Take 1 tablet (5 mg total) by mouth daily. ?  ?Gemtesa 75 MG Tabs ?Generic drug:  Vibegron ?Take 75 mg by mouth daily. ?Started by: Hollice Espy, MD ?  ?latanoprost 0.005 % ophthalmic solution ?Commonly known as: XALATAN ?1 drop nightly ?  ?latanoprost 0.005 % ophthalmic solution ?Commonly known as: XALATAN ?Administer 1 drop to both eyes nightly. ?  ?meclizine 12.5 MG tablet ?Commonly known as: ANTIVERT ?Take 1 tablet (12.5 mg total) by mouth 2 (two) times daily as needed for dizziness. ?  ?prednisoLONE acetate 1 % ophthalmic suspension ?Commonly known as: PRED FORTE ?SMARTSIG:In Eye(s) ?  ?rizatriptan 5 MG tablet ?Commonly known as: MAXALT ?Take by mouth. ?  ?simvastatin 20 MG tablet ?Commonly known as: ZOCOR ?Take 20 mg by mouth daily. ?  ?VITAMIN B-12 PO ?Take by mouth daily. ?  ? ?  ? ? ?Allergies:  ?Allergies  ?Allergen Reactions  ? Topiramate Other (See Comments)  ?  Orthostatic BP/Low blood pressure  ?Orthostatic BP/Low blood pressure  ?  ? Advicor [Niacin-Lovastatin Er]   ? Niacin   ? Penicillins Rash  ? ? ?Family History: ?Family History  ?Problem Relation Age of Onset  ? Prostate cancer Father   ? Heart disease Mother   ? ? ?Social History:  reports that he has never smoked. He has never used smokeless tobacco. He reports that he does not drink alcohol and does not use drugs. ? ? ?Physical Exam: ?BP (!) 157/97   Pulse 71   Ht '5\' 7"'$  (1.702 m)   Wt 160 lb (72.6 kg)   BMI 25.06 kg/m?   ?Constitutional:  Alert and oriented, No acute distress. ?HEENT: [place] AT, moist mucus membranes.  Trachea midline, no masses. ?Cardiovascular: No clubbing, cyanosis, or edema. ?Respiratory: Normal respiratory effort, no increased work of breathing. ?Skin: No rashes, bruises or suspicious lesions. ?Neurologic: Grossly intact, no focal deficits, moving all 4 extremities. ?Psychiatric: Normal mood and affect. ? ?Laboratory Data: ? ?Lab Results  ?Component Value Date  ? CREATININE 1.08 05/02/2021  ? ? ?Pertinent Imaging: ?Results for orders placed or performed in visit on 08/03/21  ?Microscopic Examination   ? Urine  ?Result Value Ref Range  ? WBC, UA None seen 0 - 5 /hpf  ? RBC 0-2 0 - 2 /hpf  ? Epithelial Cells (non renal) None seen 0 - 10 /hpf  ? Bacteria, UA None seen None seen/Few  ?Urinalysis, Complete  ?Result Value Ref Range  ? Specific Gravity, UA 1.020 1.005 - 1.030  ? pH,  UA 7.0 5.0 - 7.5  ? Color, UA Yellow Yellow  ? Appearance Ur Clear Clear  ? Leukocytes,UA Negative Negative  ? Protein,UA Negative Negative/Trace  ? Glucose, UA Negative Negative  ? Ketones, UA Negative Negative  ? RBC, UA Negative Negative  ? Bilirubin, UA Negative Negative  ? Urobilinogen, Ur 1.0 0.2 - 1.0 mg/dL  ? Nitrite, UA Negative Negative  ? Microscopic Examination See below:   ?Bladder Scan (Post Void Residual) in office  ?Result Value Ref Range  ? Scan Result 84m   ? ? ?Assessment & Plan:   ? ? 1. BPH with LUTS and nocturia ?- Will request records from Dr. WYves Dill?-continue finasteride for now ?- He uK[place]emptying adequately today, no concern for infection ?- He was given a trial of Gemtesa 75 mg for storage related symptoms ? ?Return in 1 month with IPSS and PVR ? ?IConley Rollsas a sEducation administratorfor AHollice Espy MD.,have documented all relevant documentation on the behalf of AHollice Espy MD,as directed by  AHollice Espy MD while in the presence of AHollice Espy MD. ? ?I have reviewed the above documentation for accuracy and completeness, and I agree with the above.  ? ?AHollice Espy MD ? ? ?[facility]?[address] ?[place] [address]?[phone]? ?

## 2021-08-03 ENCOUNTER — Other Ambulatory Visit: Payer: Self-pay

## 2021-08-03 ENCOUNTER — Ambulatory Visit: Payer: PPO | Admitting: Urology

## 2021-08-03 VITALS — BP 157/97 | HR 71 | Ht 67.0 in | Wt 160.0 lb

## 2021-08-03 DIAGNOSIS — R350 Frequency of micturition: Secondary | ICD-10-CM | POA: Diagnosis not present

## 2021-08-03 LAB — URINALYSIS, COMPLETE
Bilirubin, UA: NEGATIVE
Glucose, UA: NEGATIVE
Ketones, UA: NEGATIVE
Leukocytes,UA: NEGATIVE
Nitrite, UA: NEGATIVE
Protein,UA: NEGATIVE
RBC, UA: NEGATIVE
Specific Gravity, UA: 1.02 (ref 1.005–1.030)
Urobilinogen, Ur: 1 mg/dL (ref 0.2–1.0)
pH, UA: 7 (ref 5.0–7.5)

## 2021-08-03 LAB — MICROSCOPIC EXAMINATION
Bacteria, UA: NONE SEEN
Epithelial Cells (non renal): NONE SEEN /hpf (ref 0–10)
WBC, UA: NONE SEEN /hpf (ref 0–5)

## 2021-08-03 LAB — BLADDER SCAN AMB NON-IMAGING

## 2021-08-03 MED ORDER — GEMTESA 75 MG PO TABS: 75.0000 mg | ORAL_TABLET | Freq: Every day | ORAL | 0 refills | Status: DC

## 2021-08-11 DIAGNOSIS — Z947 Corneal transplant status: Secondary | ICD-10-CM | POA: Diagnosis not present

## 2021-08-30 NOTE — Progress Notes (Signed)
? ?08/31/21 ?9:44 AM  ? ?Adrian Mitchell ?11-01-1940 ?[id number] ? ?Referring provider:  ?Maryland Pink, MD ?[place] [facility] ?[place],  [place] [address] ?No chief complaint on file. ? ? ? ?HPI: ?Adrian Mitchell is a 81 y.o.male with a personal history of BPH with LUTS, and nocturia , who presents today for a 1 month follow-up with IPSS and PVR.  ? ?He underwent a prostate biopsy in 2011 for elevated PSA. Pathology was negative for malignancy. Prostate volume was 28 cc at the time.  ? ?He was previously on Flomax in the past but he  did not tolerate this due to dizziness.  He has a distant history of cystoscopy with Dr. Yves Dill, which reportedly showed prostatic enlargement but no stricture or other pathology. ? ?His most recent PSA was 0.6 on 11/2020.  ? ?He reports that Logan Bores has helped his urinary symptoms he is only getting up 2x nightly to urinate he was previously getting up 3-4x nightly. He has been trying to stop fluid intake before bed; he still likes to have milk and cookies before bedtime.  ? ? IPSS   ? ? [place] Name 08/31/21 0900  ?  ?  ?  ? International Prostate Symptom Score  ? How often have you had the sensation of not emptying your bladder? About half the time    ? How often have you had to urinate less than every two hours? About half the time    ? How often have you found you stopped and started again several times when you urinated? About half the time    ? How often have you found it difficult to postpone urination? Not at All    ? How often have you had a weak urinary stream? Less than half the time    ? How often have you had to strain to start urination? Less than half the time    ? How many times did you typically get up at night to urinate? 2 Times    ? Total IPSS Score 15    ?  ? Quality of Life due to urinary symptoms  ? If you were to spend the rest of your life with your urinary condition just the way it is now how would you feel about that? Mixed    ? ?  ?  ? ?  ? ? ?Score:   ?1-7 Mild ?8-19 Moderate ?20-35 Severe ? ? ?PMH: ?Past Medical History:  ?Diagnosis Date  ? ED (erectile dysfunction)   ? Glaucoma   ? Heart murmur   ? History of shingles   ? HLD (hyperlipidemia)   ? Migraine   ? none for over 3 months  ? Postherpetic neuralgia   ? Wears dentures   ? full upper and lower  ? ? ?Surgical History: ?Past Surgical History:  ?Procedure Laterality Date  ? CATARACT EXTRACTION W/PHACO Right 07/23/2019  ? Procedure: CATARACT EXTRACTION PHACO AND INTRAOCULAR LENS PLACEMENT (IOC) RIGHT 6.92 00:51.1 13.5%;  Surgeon: Leandrew Koyanagi, MD;  Location: [facility];  Service: Ophthalmology;  Laterality: Right;  ? COLONOSCOPY  2013  ? CYSTOSCOPY  2016  ? HERNIA REPAIR    ? 84yr ago  ? ? ?Home Medications:  ?Allergies as of 08/31/2021   ? ?   Reactions  ? Topiramate Other (See Comments)  ? Orthostatic BP/Low blood pressure  ?Orthostatic BP/Low blood pressure   ? Advicor [niacin-lovastatin Er]   ? Niacin   ? Penicillins  Rash  ? ?  ? ?  ?Medication List  ?  ? ?  ? Accurate as of August 31, 2021  9:44 AM. If you have any questions, ask your nurse or doctor.  ?  ?  ? ?  ? ?amLODipine 2.5 MG tablet ?Commonly known as: NORVASC ?  ?aspirin 81 MG tablet ?Take 81 mg by mouth 2 (two) times daily. ?  ?aspirin-sod bicarb-citric acid 325 MG Tbef tablet ?Commonly known as: ALKA-SELTZER ?Take 325 mg by mouth every 6 (six) hours as needed. ?  ?brimonidine 0.2 % ophthalmic solution ?Commonly known as: ALPHAGAN ?SMARTSIG:In Eye(s) ?  ?butalbital-acetaminophen-caffeine 50-325-40 MG tablet ?Commonly known as: FIORICET ?Take 1 tablet by mouth 2 (two) times daily as needed for headache. ?  ?dorzolamide-timolol 22.3-6.8 MG/ML ophthalmic solution ?Commonly known as: COSOPT ?Place 1 drop into both eyes 2 (two) times daily. ?  ?finasteride 5 MG tablet ?Commonly known as: PROSCAR ?Take 1 tablet (5 mg total) by mouth daily. ?  ?Gemtesa 75 MG Tabs ?Generic drug: Vibegron ?Take 75 mg by mouth daily. ?  ?latanoprost 0.005  % ophthalmic solution ?Commonly known as: XALATAN ?1 drop nightly ?  ?latanoprost 0.005 % ophthalmic solution ?Commonly known as: XALATAN ?Administer 1 drop to both eyes nightly. ?  ?meclizine 12.5 MG tablet ?Commonly known as: ANTIVERT ?Take 1 tablet (12.5 mg total) by mouth 2 (two) times daily as needed for dizziness. ?  ?prednisoLONE acetate 1 % ophthalmic suspension ?Commonly known as: PRED FORTE ?SMARTSIG:In Eye(s) ?  ?rizatriptan 5 MG tablet ?Commonly known as: MAXALT ?Take by mouth. ?  ?simvastatin 20 MG tablet ?Commonly known as: ZOCOR ?Take 20 mg by mouth daily. ?  ?VITAMIN B-12 PO ?Take by mouth daily. ?  ? ?  ? ? ?Allergies:  ?Allergies  ?Allergen Reactions  ? Topiramate Other (See Comments)  ?  Orthostatic BP/Low blood pressure  ?Orthostatic BP/Low blood pressure  ?  ? Advicor [Niacin-Lovastatin Er]   ? Niacin   ? Penicillins Rash  ? ? ?Family History: ?Family History  ?Problem Relation Age of Onset  ? Prostate cancer Father   ? Heart disease Mother   ? ? ?Social History:  reports that he has never smoked. He has never used smokeless tobacco. He reports that he does not drink alcohol and does not use drugs. ? ? ?Physical Exam: ?BP (!) 144/73   Pulse 87   Ht '5\' 7"'$  (1.702 m)   Wt 160 lb (72.6 kg)   BMI 25.06 kg/m?   ?Constitutional:  Alert and oriented, No acute distress. ?HEENT: [place] AT, moist mucus membranes.  Trachea midline, no masses. ?Cardiovascular: No clubbing, cyanosis, or edema. ?Respiratory: Normal respiratory effort, no increased work of breathing. ?Skin: No rashes, bruises or suspicious lesions. ?Neurologic: Grossly intact, no focal deficits, moving all 4 extremities. ?Psychiatric: Normal mood and affect. ? ?Laboratory Data: ?Lab Results  ?Component Value Date  ? CREATININE 1.08 05/02/2021  ? ? ?Pertinent Imaging: ?Results for orders placed or performed in visit on 08/31/21  ?Bladder Scan (Post Void Residual) in office  ?Result Value Ref Range  ? Scan Result 0   ? ? ? ?Assessment & Plan:   ?  1. BPH with LUTS and nocturia ?- He is emptying adequately today with a PVR of 0 ml  ?- Has improvement on Gemtesa only getting up 2x nightly to void ?- Continue finasteride and prescription sent for Gemtesa  ?- Will monitor in 6 months  ? ?Return in 6 months with PA-C for  IPSS and PVR  ? ?I,Kailey Littlejohn,acting as a Education administrator for Hollice Espy, MD.,have documented all relevant documentation on the behalf of Hollice Espy, MD,as directed by  Hollice Espy, MD while in the presence of Hollice Espy, MD. ? ?I have reviewed the above documentation for accuracy and completeness, and I agree with the above.  ? ?Hollice Espy, MD ? ? ?[facility] ?[address], [address] ?[place], [place] [address] ?[phone] ?

## 2021-08-31 ENCOUNTER — Encounter: Payer: Self-pay | Admitting: Urology

## 2021-08-31 ENCOUNTER — Ambulatory Visit: Payer: PPO | Admitting: Urology

## 2021-08-31 ENCOUNTER — Telehealth: Payer: Self-pay | Admitting: *Deleted

## 2021-08-31 VITALS — BP 144/73 | HR 87 | Ht 67.0 in | Wt 160.0 lb

## 2021-08-31 DIAGNOSIS — R350 Frequency of micturition: Secondary | ICD-10-CM

## 2021-08-31 LAB — BLADDER SCAN AMB NON-IMAGING: Scan Result: 0

## 2021-08-31 MED ORDER — GEMTESA 75 MG PO TABS: 75.0000 mg | ORAL_TABLET | Freq: Every day | ORAL | 11 refills | Status: AC

## 2021-08-31 NOTE — Telephone Encounter (Signed)
Adrian Mitchell (Key: [place]) - [id number] ?Gemtesa '75MG'$  tablets ?    ?Status: PA Request ? ?Created: April 5th, 2023 [phone] ? ?Sent: April 5th, 2023 ?

## 2021-09-07 ENCOUNTER — Other Ambulatory Visit: Payer: Self-pay

## 2021-09-07 DIAGNOSIS — N401 Enlarged prostate with lower urinary tract symptoms: Secondary | ICD-10-CM

## 2021-09-07 MED ORDER — FINASTERIDE 5 MG PO TABS: 5.0000 mg | ORAL_TABLET | Freq: Every day | ORAL | 3 refills | Status: DC

## 2021-09-28 DIAGNOSIS — G930 Cerebral cysts: Secondary | ICD-10-CM | POA: Diagnosis not present

## 2021-09-28 DIAGNOSIS — G939 Disorder of brain, unspecified: Secondary | ICD-10-CM | POA: Diagnosis not present

## 2021-09-28 DIAGNOSIS — G43109 Migraine with aura, not intractable, without status migrainosus: Secondary | ICD-10-CM | POA: Diagnosis not present

## 2021-10-25 DIAGNOSIS — Z03818 Encounter for observation for suspected exposure to other biological agents ruled out: Secondary | ICD-10-CM | POA: Diagnosis not present

## 2021-10-25 DIAGNOSIS — G43719 Chronic migraine without aura, intractable, without status migrainosus: Secondary | ICD-10-CM | POA: Diagnosis not present

## 2021-10-25 DIAGNOSIS — G43109 Migraine with aura, not intractable, without status migrainosus: Secondary | ICD-10-CM | POA: Diagnosis not present

## 2021-10-25 DIAGNOSIS — J069 Acute upper respiratory infection, unspecified: Secondary | ICD-10-CM | POA: Diagnosis not present

## 2021-12-13 DIAGNOSIS — E782 Mixed hyperlipidemia: Secondary | ICD-10-CM | POA: Diagnosis not present

## 2021-12-13 DIAGNOSIS — I10 Essential (primary) hypertension: Secondary | ICD-10-CM | POA: Diagnosis not present

## 2021-12-15 DIAGNOSIS — G930 Cerebral cysts: Secondary | ICD-10-CM | POA: Diagnosis not present

## 2021-12-15 DIAGNOSIS — G939 Disorder of brain, unspecified: Secondary | ICD-10-CM | POA: Diagnosis not present

## 2021-12-15 DIAGNOSIS — E538 Deficiency of other specified B group vitamins: Secondary | ICD-10-CM | POA: Diagnosis not present

## 2021-12-15 DIAGNOSIS — G43109 Migraine with aura, not intractable, without status migrainosus: Secondary | ICD-10-CM | POA: Diagnosis not present

## 2021-12-21 DIAGNOSIS — R1312 Dysphagia, oropharyngeal phase: Secondary | ICD-10-CM | POA: Diagnosis not present

## 2021-12-21 DIAGNOSIS — R0789 Other chest pain: Secondary | ICD-10-CM | POA: Diagnosis not present

## 2021-12-21 DIAGNOSIS — R918 Other nonspecific abnormal finding of lung field: Secondary | ICD-10-CM | POA: Diagnosis not present

## 2021-12-28 DIAGNOSIS — I208 Other forms of angina pectoris: Secondary | ICD-10-CM | POA: Diagnosis not present

## 2021-12-28 DIAGNOSIS — E782 Mixed hyperlipidemia: Secondary | ICD-10-CM | POA: Diagnosis not present

## 2021-12-28 DIAGNOSIS — I10 Essential (primary) hypertension: Secondary | ICD-10-CM | POA: Diagnosis not present

## 2022-01-04 DIAGNOSIS — I208 Other forms of angina pectoris: Secondary | ICD-10-CM | POA: Diagnosis not present

## 2022-01-05 DIAGNOSIS — I208 Other forms of angina pectoris: Secondary | ICD-10-CM | POA: Diagnosis not present

## 2022-01-31 DIAGNOSIS — E782 Mixed hyperlipidemia: Secondary | ICD-10-CM | POA: Diagnosis not present

## 2022-01-31 DIAGNOSIS — I10 Essential (primary) hypertension: Secondary | ICD-10-CM | POA: Diagnosis not present

## 2022-02-17 ENCOUNTER — Other Ambulatory Visit: Payer: Self-pay | Admitting: Gastroenterology

## 2022-02-17 DIAGNOSIS — R748 Abnormal levels of other serum enzymes: Secondary | ICD-10-CM | POA: Diagnosis not present

## 2022-02-17 DIAGNOSIS — R1013 Epigastric pain: Secondary | ICD-10-CM

## 2022-02-24 ENCOUNTER — Ambulatory Visit
Admission: RE | Admit: 2022-02-24 | Discharge: 2022-02-24 | Disposition: A | Discharge status: Home / Self Care | Payer: PPO | Source: Ambulatory Visit | Attending: Gastroenterology | Admitting: Gastroenterology

## 2022-02-24 DIAGNOSIS — R748 Abnormal levels of other serum enzymes: Secondary | ICD-10-CM | POA: Insufficient documentation

## 2022-02-24 DIAGNOSIS — Z8719 Personal history of other diseases of the digestive system: Secondary | ICD-10-CM | POA: Diagnosis not present

## 2022-02-24 DIAGNOSIS — R935 Abnormal findings on diagnostic imaging of other abdominal regions, including retroperitoneum: Secondary | ICD-10-CM | POA: Diagnosis not present

## 2022-02-24 DIAGNOSIS — R1013 Epigastric pain: Secondary | ICD-10-CM | POA: Insufficient documentation

## 2022-02-24 MED ORDER — GADOPICLENOL 0.5 MMOL/ML IV SOLN
7.5000 mL | Freq: Once | INTRAVENOUS | Status: AC | PRN
  Administered 2022-02-24: 7.5 mL via INTRAVENOUS

## 2022-03-02 ENCOUNTER — Encounter: Payer: Self-pay | Admitting: Physician Assistant

## 2022-03-02 ENCOUNTER — Ambulatory Visit: Payer: PPO | Admitting: Physician Assistant

## 2022-03-02 VITALS — BP 127/82 | HR 78 | Ht 67.0 in | Wt 158.0 lb

## 2022-03-02 DIAGNOSIS — R350 Frequency of micturition: Secondary | ICD-10-CM

## 2022-03-02 DIAGNOSIS — R351 Nocturia: Secondary | ICD-10-CM | POA: Diagnosis not present

## 2022-03-02 DIAGNOSIS — N401 Enlarged prostate with lower urinary tract symptoms: Secondary | ICD-10-CM

## 2022-03-02 LAB — BLADDER SCAN AMB NON-IMAGING: Scan Result: 0

## 2022-03-02 NOTE — Progress Notes (Signed)
03/02/2022 12:19 PM   Adrian Mitchell 15-Apr-1941 [id number]  CC: Chief Complaint  Patient presents with   Urinary Frequency    HPI: Adrian Mitchell is a 81 y.o. male with PMH BPH with nocturia on finasteride and Gemtesa and elevated PSA with negative biopsy in 2011 who previously discontinued Flomax due to dizziness who presents today for 77-monthfollow-up.   Today he reports no acute concerns or changes in his voiding symptoms over the past 6 months.  He continues to experience nocturia x2-3.  He has no bothersome daytime voiding symptoms.  He helps to take care of his wife at home and sometimes will get up to help her go to the bathroom as well.  IPSS 12/mixed as below, previously 15/mixed.  PVR 0 mL.   IPSS     Row Name 03/02/22 1000         International Prostate Symptom Score   How often have you had the sensation of not emptying your bladder? Not at All     How often have you had to urinate less than every two hours? Less than half the time     How often have you found you stopped and started again several times when you urinated? Almost always     How often have you found it difficult to postpone urination? Not at All     How often have you had a weak urinary stream? About half the time     How often have you had to strain to start urination? Not at All     How many times did you typically get up at night to urinate? 2 Times     Total IPSS Score 12       Quality of Life due to urinary symptoms   If you were to spend the rest of your life with your urinary condition just the way it is now how would you feel about that? Mixed             PMH: Past Medical History:  Diagnosis Date   ED (erectile dysfunction)    Glaucoma    Heart murmur    History of shingles    HLD (hyperlipidemia)    Migraine    none for over 3 months   Postherpetic neuralgia    Wears dentures    full upper and lower    Surgical History: Past Surgical History:  Procedure  Laterality Date   CATARACT EXTRACTION W/PHACO Right 07/23/2019   Procedure: CATARACT EXTRACTION PHACO AND INTRAOCULAR LENS PLACEMENT (IOC) RIGHT 6.92 00:51.1 13.5%;  Surgeon: BLeandrew Koyanagi MD;  Location: [facility]  Service: Ophthalmology;  Laterality: Right;   COLONOSCOPY  2013   CYSTOSCOPY  2016   HERNIA REPAIR     [id number]    Home Medications:  Allergies as of 03/02/2022       Reactions   Topiramate Other (See Comments)   Orthostatic BP/Low blood pressure  Orthostatic BP/Low blood pressure    Advicor [niacin-lovastatin Er]    Niacin    Penicillins Rash        Medication List        Accurate as of March 02, 2022 12:19 PM. If you have any questions, ask your nurse or doctor.          amLODipine 2.5 MG tablet Commonly known as: NORVASC   aspirin 81 MG tablet Take 81 mg by mouth 2 (two) times daily.   aspirin-sod bicarb-citric acid 325  MG Tbef tablet Commonly known as: ALKA-SELTZER Take 325 mg by mouth every 6 (six) hours as needed.   brimonidine 0.2 % ophthalmic solution Commonly known as: ALPHAGAN SMARTSIG:In Eye(s)   butalbital-acetaminophen-caffeine 50-325-40 MG tablet Commonly known as: FIORICET Take 1 tablet by mouth 2 (two) times daily as needed for headache.   dorzolamide-timolol 22.3-6.8 MG/ML ophthalmic solution Commonly known as: COSOPT Place 1 drop into both eyes 2 (two) times daily.   finasteride 5 MG tablet Commonly known as: PROSCAR Take 1 tablet (5 mg total) by mouth daily.   Gemtesa 75 MG Tabs Generic drug: Vibegron Take 1 tablet by mouth daily.   latanoprost 0.005 % ophthalmic solution Commonly known as: XALATAN 1 drop nightly   latanoprost 0.005 % ophthalmic solution Commonly known as: XALATAN Administer 1 drop to both eyes nightly.   meclizine 12.5 MG tablet Commonly known as: ANTIVERT Take 1 tablet (12.5 mg total) by mouth 2 (two) times daily as needed for dizziness.   pantoprazole 20 MG tablet Commonly  known as: PROTONIX Take by mouth.   prednisoLONE acetate 1 % ophthalmic suspension Commonly known as: PRED FORTE SMARTSIG:In Eye(s)   rizatriptan 5 MG tablet Commonly known as: MAXALT Take by mouth.   simvastatin 20 MG tablet Commonly known as: ZOCOR Take 20 mg by mouth daily.   VITAMIN B-12 PO Take by mouth daily.        Allergies:  Allergies  Allergen Reactions   Topiramate Other (See Comments)    Orthostatic BP/Low blood pressure  Orthostatic BP/Low blood pressure     Advicor [Niacin-Lovastatin Er]    Niacin    Penicillins Rash    Family History: Family History  Problem Relation Age of Onset   Prostate cancer Father    Heart disease Mother     Social History:   reports that he has never smoked. He has never used smokeless tobacco. He reports that he does not drink alcohol and does not use drugs.  Physical Exam: BP 127/82   Pulse 78   Ht '5\' 7"'$  (1.702 m)   Wt 158 lb (71.7 kg)   BMI 24.75 kg/m   Constitutional:  Alert and oriented, no acute distress, nontoxic appearing HEENT: [place], AT Cardiovascular: No clubbing, cyanosis, or edema Respiratory: Normal respiratory effort, no increased work of breathing Skin: No rashes, bruises or suspicious lesions Neurologic: Grossly intact, no focal deficits, moving all 4 extremities Psychiatric: Normal mood and affect  Laboratory Data: Results for orders placed or performed in visit on 03/02/22  Bladder Scan (Post Void Residual) in office  Result Value Ref Range   Scan Result 0    Assessment & Plan:   1. Benign prostatic hyperplasia with nocturia Symptoms stable.  Minimal bother.  He is emptying appropriately and IPSS is slightly down compared to prior.  We will plan to continue finasteride and Gemtesa and will see him back next year, sooner if needed based on symptomatic worsening or increased bother.  He is in agreement with this plan. - Bladder Scan (Post Void Residual) in office  Return in about 1 year (around  03/03/2023) for Annual DRE/IPSS with Dr. Erlene Quan.  Debroah Loop, PA-C  [facility] [address], [place], [place] [address] [phone]

## 2022-03-06 DIAGNOSIS — H401112 Primary open-angle glaucoma, right eye, moderate stage: Secondary | ICD-10-CM | POA: Diagnosis not present

## 2022-03-09 DIAGNOSIS — Z125 Encounter for screening for malignant neoplasm of prostate: Secondary | ICD-10-CM | POA: Diagnosis not present

## 2022-03-09 DIAGNOSIS — I10 Essential (primary) hypertension: Secondary | ICD-10-CM | POA: Diagnosis not present

## 2022-03-16 DIAGNOSIS — I10 Essential (primary) hypertension: Secondary | ICD-10-CM | POA: Diagnosis not present

## 2022-03-16 DIAGNOSIS — G43119 Migraine with aura, intractable, without status migrainosus: Secondary | ICD-10-CM | POA: Diagnosis not present

## 2022-03-16 DIAGNOSIS — Z125 Encounter for screening for malignant neoplasm of prostate: Secondary | ICD-10-CM | POA: Diagnosis not present

## 2022-03-16 DIAGNOSIS — E785 Hyperlipidemia, unspecified: Secondary | ICD-10-CM | POA: Diagnosis not present

## 2022-03-16 DIAGNOSIS — Z0000 Encounter for general adult medical examination without abnormal findings: Secondary | ICD-10-CM | POA: Diagnosis not present

## 2022-04-04 DIAGNOSIS — R748 Abnormal levels of other serum enzymes: Secondary | ICD-10-CM | POA: Diagnosis not present

## 2022-04-04 DIAGNOSIS — K219 Gastro-esophageal reflux disease without esophagitis: Secondary | ICD-10-CM | POA: Diagnosis not present

## 2022-05-01 DIAGNOSIS — G930 Cerebral cysts: Secondary | ICD-10-CM | POA: Diagnosis not present

## 2022-05-01 DIAGNOSIS — G939 Disorder of brain, unspecified: Secondary | ICD-10-CM | POA: Diagnosis not present

## 2022-05-01 DIAGNOSIS — G43109 Migraine with aura, not intractable, without status migrainosus: Secondary | ICD-10-CM | POA: Diagnosis not present

## 2022-06-20 DIAGNOSIS — H8110 Benign paroxysmal vertigo, unspecified ear: Secondary | ICD-10-CM | POA: Diagnosis not present

## 2022-06-20 DIAGNOSIS — R030 Elevated blood-pressure reading, without diagnosis of hypertension: Secondary | ICD-10-CM | POA: Diagnosis not present

## 2022-06-30 DIAGNOSIS — H35352 Cystoid macular degeneration, left eye: Secondary | ICD-10-CM | POA: Diagnosis not present

## 2022-06-30 DIAGNOSIS — H1820 Unspecified corneal edema: Secondary | ICD-10-CM | POA: Diagnosis not present

## 2022-06-30 DIAGNOSIS — H401112 Primary open-angle glaucoma, right eye, moderate stage: Secondary | ICD-10-CM | POA: Diagnosis not present

## 2022-06-30 DIAGNOSIS — H401123 Primary open-angle glaucoma, left eye, severe stage: Secondary | ICD-10-CM | POA: Diagnosis not present

## 2022-07-03 DIAGNOSIS — Z7982 Long term (current) use of aspirin: Secondary | ICD-10-CM | POA: Diagnosis not present

## 2022-07-03 DIAGNOSIS — I10 Essential (primary) hypertension: Secondary | ICD-10-CM | POA: Diagnosis not present

## 2022-07-03 DIAGNOSIS — E782 Mixed hyperlipidemia: Secondary | ICD-10-CM | POA: Diagnosis not present

## 2022-07-11 DIAGNOSIS — G939 Disorder of brain, unspecified: Secondary | ICD-10-CM | POA: Diagnosis not present

## 2022-07-11 DIAGNOSIS — G43109 Migraine with aura, not intractable, without status migrainosus: Secondary | ICD-10-CM | POA: Diagnosis not present

## 2022-07-11 DIAGNOSIS — E538 Deficiency of other specified B group vitamins: Secondary | ICD-10-CM | POA: Diagnosis not present

## 2022-07-11 DIAGNOSIS — G930 Cerebral cysts: Secondary | ICD-10-CM | POA: Diagnosis not present

## 2022-09-05 DIAGNOSIS — R5383 Other fatigue: Secondary | ICD-10-CM | POA: Diagnosis not present

## 2022-09-05 DIAGNOSIS — J111 Influenza due to unidentified influenza virus with other respiratory manifestations: Secondary | ICD-10-CM | POA: Diagnosis not present

## 2022-09-05 DIAGNOSIS — R051 Acute cough: Secondary | ICD-10-CM | POA: Diagnosis not present

## 2022-09-05 DIAGNOSIS — Z20828 Contact with and (suspected) exposure to other viral communicable diseases: Secondary | ICD-10-CM | POA: Diagnosis not present

## 2022-09-11 DIAGNOSIS — H401123 Primary open-angle glaucoma, left eye, severe stage: Secondary | ICD-10-CM | POA: Diagnosis not present

## 2022-09-11 DIAGNOSIS — H35352 Cystoid macular degeneration, left eye: Secondary | ICD-10-CM | POA: Diagnosis not present

## 2022-09-11 DIAGNOSIS — H401112 Primary open-angle glaucoma, right eye, moderate stage: Secondary | ICD-10-CM | POA: Diagnosis not present

## 2022-09-11 DIAGNOSIS — H1820 Unspecified corneal edema: Secondary | ICD-10-CM | POA: Diagnosis not present

## 2022-09-19 DIAGNOSIS — E785 Hyperlipidemia, unspecified: Secondary | ICD-10-CM | POA: Diagnosis not present

## 2022-09-19 DIAGNOSIS — Z125 Encounter for screening for malignant neoplasm of prostate: Secondary | ICD-10-CM | POA: Diagnosis not present

## 2022-09-19 DIAGNOSIS — I10 Essential (primary) hypertension: Secondary | ICD-10-CM | POA: Diagnosis not present

## 2022-09-26 DIAGNOSIS — R052 Subacute cough: Secondary | ICD-10-CM | POA: Diagnosis not present

## 2022-09-26 DIAGNOSIS — I10 Essential (primary) hypertension: Secondary | ICD-10-CM | POA: Diagnosis not present

## 2022-09-26 DIAGNOSIS — G43119 Migraine with aura, intractable, without status migrainosus: Secondary | ICD-10-CM | POA: Diagnosis not present

## 2022-09-26 DIAGNOSIS — E785 Hyperlipidemia, unspecified: Secondary | ICD-10-CM | POA: Diagnosis not present

## 2022-09-26 DIAGNOSIS — J309 Allergic rhinitis, unspecified: Secondary | ICD-10-CM | POA: Diagnosis not present

## 2022-10-16 DIAGNOSIS — G43109 Migraine with aura, not intractable, without status migrainosus: Secondary | ICD-10-CM | POA: Diagnosis not present

## 2022-10-16 DIAGNOSIS — G930 Cerebral cysts: Secondary | ICD-10-CM | POA: Diagnosis not present

## 2022-10-16 DIAGNOSIS — E538 Deficiency of other specified B group vitamins: Secondary | ICD-10-CM | POA: Diagnosis not present

## 2022-10-18 DIAGNOSIS — H401123 Primary open-angle glaucoma, left eye, severe stage: Secondary | ICD-10-CM | POA: Diagnosis not present

## 2022-10-18 DIAGNOSIS — H401112 Primary open-angle glaucoma, right eye, moderate stage: Secondary | ICD-10-CM | POA: Diagnosis not present

## 2022-10-18 DIAGNOSIS — H1820 Unspecified corneal edema: Secondary | ICD-10-CM | POA: Diagnosis not present

## 2022-10-27 ENCOUNTER — Other Ambulatory Visit: Payer: Self-pay | Admitting: Urology

## 2022-10-27 DIAGNOSIS — N401 Enlarged prostate with lower urinary tract symptoms: Secondary | ICD-10-CM

## 2022-11-08 DIAGNOSIS — I361 Nonrheumatic tricuspid (valve) insufficiency: Secondary | ICD-10-CM | POA: Diagnosis not present

## 2022-11-08 DIAGNOSIS — I517 Cardiomegaly: Secondary | ICD-10-CM | POA: Diagnosis not present

## 2022-11-08 DIAGNOSIS — E782 Mixed hyperlipidemia: Secondary | ICD-10-CM | POA: Diagnosis not present

## 2022-11-08 DIAGNOSIS — Z7982 Long term (current) use of aspirin: Secondary | ICD-10-CM | POA: Diagnosis not present

## 2022-11-08 DIAGNOSIS — I340 Nonrheumatic mitral (valve) insufficiency: Secondary | ICD-10-CM | POA: Diagnosis not present

## 2022-11-08 DIAGNOSIS — I10 Essential (primary) hypertension: Secondary | ICD-10-CM | POA: Diagnosis not present

## 2022-11-08 DIAGNOSIS — I440 Atrioventricular block, first degree: Secondary | ICD-10-CM | POA: Diagnosis not present

## 2022-11-15 DIAGNOSIS — H401123 Primary open-angle glaucoma, left eye, severe stage: Secondary | ICD-10-CM | POA: Diagnosis not present

## 2022-11-15 DIAGNOSIS — H401112 Primary open-angle glaucoma, right eye, moderate stage: Secondary | ICD-10-CM | POA: Diagnosis not present

## 2022-11-15 DIAGNOSIS — H1820 Unspecified corneal edema: Secondary | ICD-10-CM | POA: Diagnosis not present

## 2022-12-06 ENCOUNTER — Ambulatory Visit: Payer: PPO | Admitting: Dermatology

## 2022-12-06 DIAGNOSIS — L719 Rosacea, unspecified: Secondary | ICD-10-CM

## 2022-12-06 MED ORDER — DOXYCYCLINE MONOHYDRATE 100 MG PO CAPS
100.0000 mg | ORAL_CAPSULE | Freq: Every day | ORAL | 2 refills | Status: DC
Start: 2022-12-06 — End: 2024-03-25

## 2022-12-06 NOTE — Progress Notes (Signed)
   New Patient Visit   Subjective  Adrian Mitchell is a 82 y.o. male who presents for the following: breaking out at face for about 1 month, left > right. Patient has seen Dr. Gwen Pounds for rosacea and has been using metronidazole 0.75% cream twice daily but it is not helping anymore. No itching, no burning. No new products.    The following portions of the chart were reviewed this encounter and updated as appropriate: medications, allergies, medical history  Review of Systems:  No other skin or systemic complaints except as noted in HPI or Assessment and Plan.  Objective  Well appearing patient in no apparent distress; mood and affect are within normal limits.   A focused examination was performed of the following areas: face  Relevant exam findings are noted in the Assessment and Plan.    Assessment & Plan   ROSACEA Exam Multiple inflammatory papules at cheek, neck, left > right  Chronic and persistent condition with duration or expected duration over one year. Condition is bothersome/symptomatic for patient. Currently flared.   Rosacea is a chronic progressive skin condition usually affecting the face of adults, causing redness and/or acne bumps. It is treatable but not curable. It sometimes affects the eyes (ocular rosacea) as well. It may respond to topical and/or systemic medication and can flare with stress, sun exposure, alcohol, exercise, topical steroids (including hydrocortisone/cortisone 10) and some foods.  Daily application of broad spectrum spf 30+ sunscreen to face is recommended to reduce flares.   Treatment Plan Take doxycycline 100 mg tablet by mouth every day with food.  Doxycycline should be taken with food to prevent nausea. Do not lay down for 30 minutes after taking. Be cautious with sun exposure and use good sun protection while on this medication. Pregnant women should not take this medication.   Will prescribe [facility]  metronidazole/ivermectin/azelaic acid once to twice daily as needed to affected areas on the face. The patient was advised this is not covered by insurance since it is made by a compounding pharmacy. They will receive an email to check out and the medication will be mailed to their home.    Rosacea  Related Medications doxycycline (MONODOX) 100 MG capsule Take 1 capsule (100 mg total) by mouth daily with supper.    Return for 2 - 3 month follow up, Rosacea.  Anise Salvo, RMA, am acting as scribe for Willeen Niece, MD .   Documentation: I have reviewed the above documentation for accuracy and completeness, and I agree with the above.  Willeen Niece, MD

## 2022-12-06 NOTE — Patient Instructions (Addendum)
Start doxycycline 100 mg once daily with food.   Doxycycline should be taken with food to prevent nausea. Do not lay down for 30 minutes after taking. Be cautious with sun exposure and use good sun protection while on this medication. Pregnant women should not take this medication.   Will prescribe [facility] metronidazole/ivermectin/azelaic acid nightly as needed to affected areas on the face. The patient was advised this is not covered by insurance since it is made by a compounding pharmacy. They will receive an email to check out and the medication will be mailed to their home.   Instructions for [facility] Medications  One or more of your medications was sent to the [facility] mail order compounding pharmacy. You will receive an email from them and can purchase the medicine through that link. It will then be mailed to your home at the address you confirmed. If for any reason you do not receive an email from them, please check your spam folder. If you still do not find the email, please let us know. [facility] phone number is [phone].  Rosacea is a chronic progressive skin condition usually affecting the face of adults, causing redness and/or acne bumps. It is treatable but not curable. It sometimes affects the eyes (ocular rosacea) as well. It may respond to topical and/or systemic medication and can flare with stress, sun exposure, alcohol, exercise, topical steroids (including hydrocortisone/cortisone 10) and some foods.  Daily application of broad spectrum spf 30+ sunscreen to face is recommended to reduce flares.  Due to recent changes in healthcare laws, you may see results of your pathology and/or laboratory studies on MyChart before the doctors have had a chance to review them. We understand that in some cases there may be results that are confusing or concerning to you. Please understand that not all results are received at the same time and often the doctors may need  to interpret multiple results in order to provide you with the best plan of care or course of treatment. Therefore, we ask that you please give [place] 2 business days to thoroughly review all your results before contacting the office for clarification. Should we see a critical lab result, you will be contacted sooner.   If You Need Anything After Your Visit  If you have any questions or concerns for your doctor, please call our main line at [phone] and press option 4 to reach your doctor's medical assistant. If no one answers, please leave a voicemail as directed and we will return your call as soon as possible. Messages left after 4 pm will be answered the following business day.   You may also send [place] a message via MyChart. We typically respond to MyChart messages within 1-2 business days.  For prescription refills, please ask your pharmacy to contact our office. Our fax number is [phone].  If you have an urgent issue when the clinic is closed that cannot wait until the next business day, you can page your doctor at the number below.    Please note that while we do our best to be available for urgent issues outside of office hours, we are not available 24/7.   If you have an urgent issue and are unable to reach [place], you may choose to seek medical care at your doctor's office, retail clinic, urgent care center, or emergency room.  If you have a medical emergency, please immediately call 911 or go to the emergency department.  Pager Numbers  -  Dr. Gwen Pounds: [phone]  - Dr. Neale Burly: [phone]  - Dr. Roseanne Reno: [phone]  In the event of inclement weather, please call our main line at [phone] for an update on the status of any delays or closures.  Dermatology Medication Tips: Please keep the boxes that topical medications come in in order to help keep track of the instructions about where and how to use these. Pharmacies typically print the medication instructions only on  the boxes and not directly on the medication tubes.   If your medication is too expensive, please contact our office at [phone] option 4 or send [place] a message through MyChart.   We are unable to tell what your co-pay for medications will be in advance as this is different depending on your insurance coverage. However, we may be able to find a substitute medication at lower cost or fill out paperwork to get insurance to cover a needed medication.   If a prior authorization is required to get your medication covered by your insurance company, please allow [place] 1-2 business days to complete this process.  Drug prices often vary depending on where the prescription is filled and some pharmacies may offer cheaper prices.  The website www.goodrx.com contains coupons for medications through different pharmacies. The prices here do not account for what the cost may be with help from insurance (it may be cheaper with your insurance), but the website can give you the price if you did not use any insurance.  - You can print the associated coupon and take it with your prescription to the pharmacy.  - You may also stop by our office during regular business hours and pick up a GoodRx coupon card.  - If you need your prescription sent electronically to a different pharmacy, notify our office through [facility] or by phone at [phone] option 4.

## 2022-12-12 ENCOUNTER — Telehealth: Payer: Self-pay

## 2022-12-12 MED ORDER — IVERMECTIN 1 % EX CREA: TOPICAL_CREAM | CUTANEOUS | 2 refills | Status: DC

## 2022-12-12 NOTE — Telephone Encounter (Signed)
I spoke with patient and advised him that prescription was sent to [facility].

## 2022-12-12 NOTE — Telephone Encounter (Signed)
Patient is not comfortable with giving his credit card information over the phone or otherwise ([facility]). He would like to know if you could send in a different prescription to [place] instead? Please advise.

## 2022-12-12 NOTE — Addendum Note (Signed)
Addended by: Cari Caraway A on: 12/12/2022 03:59 PM   Modules accepted: Orders

## 2022-12-13 ENCOUNTER — Telehealth: Payer: Self-pay

## 2022-12-13 NOTE — Telephone Encounter (Signed)
Patient called stating that Adrian Mitchell is over $600, and even if his insurance did pay for it, he wouldn't want them to have to pay that much. Patient states that he will try the [facility] mix instead, and let us know if he has any issues or concerns with medication.

## 2022-12-15 DIAGNOSIS — H401112 Primary open-angle glaucoma, right eye, moderate stage: Secondary | ICD-10-CM | POA: Diagnosis not present

## 2022-12-15 DIAGNOSIS — H1820 Unspecified corneal edema: Secondary | ICD-10-CM | POA: Diagnosis not present

## 2022-12-15 DIAGNOSIS — H401123 Primary open-angle glaucoma, left eye, severe stage: Secondary | ICD-10-CM | POA: Diagnosis not present

## 2023-01-23 DIAGNOSIS — R6889 Other general symptoms and signs: Secondary | ICD-10-CM | POA: Diagnosis not present

## 2023-02-28 ENCOUNTER — Ambulatory Visit: Payer: PPO | Admitting: Urology

## 2023-02-28 VITALS — Ht 67.0 in | Wt 155.0 lb

## 2023-02-28 DIAGNOSIS — N401 Enlarged prostate with lower urinary tract symptoms: Secondary | ICD-10-CM | POA: Diagnosis not present

## 2023-02-28 DIAGNOSIS — R351 Nocturia: Secondary | ICD-10-CM | POA: Diagnosis not present

## 2023-02-28 LAB — BLADDER SCAN AMB NON-IMAGING: Scan Result: 14

## 2023-02-28 NOTE — Progress Notes (Signed)
Marcelle Overlie Plume,acting as a scribe for Vanna Scotland, MD.,have documented all relevant documentation on the behalf of Vanna Scotland, MD,as directed by  Vanna Scotland, MD while in the presence of Vanna Scotland, MD.  02/28/2023 11:06 AM   Jason Fila [birth year] [id number]  Referring provider: Jerl Mina, MD [address] [facility] [place],  [place] [address]  Chief Complaint  Patient presents with   Benign Prostatic Hypertrophy    HPI: 82 year-old male who returns today for a routine annual follow up. He was last seen last year. He has a personal history of BPH with nocturia, managed on finasteride and gemtesa.  He also has a personal history of elevated PSA, status post negative biopsy in 2011. He is not able to tolerate Flomax.   He had a negative urinalysis in 04/20204.   His most recent PSA on 09/19/2022 was 0.93.  Today, he reports intermittency, frequency, urgency, and straining. He has not been taking gemtessa recently. He doesn't really know why.    Results for orders placed or performed in visit on 02/28/23  Bladder Scan (Post Void Residual) in office  Result Value Ref Range   Scan Result 14 ml     IPSS     Row Name 02/28/23 1000         International Prostate Symptom Score   How often have you had the sensation of not emptying your bladder? About half the time     How often have you had to urinate less than every two hours? More than half the time     How often have you found you stopped and started again several times when you urinated? Almost always     How often have you found it difficult to postpone urination? About half the time     How often have you had a weak urinary stream? More than half the time     How often have you had to strain to start urination? More than half the time     How many times did you typically get up at night to urinate? 3 Times     Total IPSS Score 26       Quality of Life due to urinary symptoms   If you  were to spend the rest of your life with your urinary condition just the way it is now how would you feel about that? Mixed              Score:  1-7 Mild 8-19 Moderate 20-35 Severe   PMH: Past Medical History:  Diagnosis Date   ED (erectile dysfunction)    Glaucoma    Heart murmur    History of shingles    HLD (hyperlipidemia)    Migraine    none for over 3 months   Postherpetic neuralgia    Wears dentures    full upper and lower    Surgical History: Past Surgical History:  Procedure Laterality Date   CATARACT EXTRACTION W/PHACO Right 07/23/2019   Procedure: CATARACT EXTRACTION PHACO AND INTRAOCULAR LENS PLACEMENT (IOC) RIGHT 6.92 00:51.1 13.5%;  Surgeon: Lockie Mola, MD;  Location: [facility];  Service: Ophthalmology;  Laterality: Right;   COLONOSCOPY  2013   CYSTOSCOPY  2016   HERNIA REPAIR     70yrs ago    Home Medications:  Allergies as of 02/28/2023       Reactions   Topiramate Other (See Comments)   Orthostatic BP/Low blood pressure  Orthostatic BP/Low blood pressure    Advicor [niacin-lovastatin Er]    Niacin    Penicillins Rash        Medication List        Accurate as of February 28, 2023 11:06 AM. If you have any questions, ask your nurse or doctor.          amLODipine 2.5 MG tablet Commonly known as: NORVASC   aspirin 81 MG tablet Take 81 mg by mouth 2 (two) times daily.   aspirin-sod bicarb-citric acid 325 MG Tbef tablet Commonly known as: ALKA-SELTZER Take 325 mg by mouth every 6 (six) hours as needed.   brimonidine 0.2 % ophthalmic solution Commonly known as: ALPHAGAN SMARTSIG:In Eye(s)   butalbital-acetaminophen-caffeine 50-325-40 MG tablet Commonly known as: FIORICET Take 1 tablet by mouth 2 (two) times daily as needed for headache.   dorzolamide-timolol 2-0.5 % ophthalmic solution Commonly known as: COSOPT Place 1 drop into both eyes 2 (two) times daily.   doxycycline 100 MG capsule Commonly known  as: MONODOX Take 1 capsule (100 mg total) by mouth daily with supper.   finasteride 5 MG tablet Commonly known as: PROSCAR Take 1 tablet (5 mg total) by mouth daily.   Gemtesa 75 MG Tabs Generic drug: Vibegron Take 1 tablet by mouth daily.   Ivermectin 1 % Crea Commonly known as: Soolantra Apply to face QHS   latanoprost 0.005 % ophthalmic solution Commonly known as: XALATAN 1 drop nightly   meclizine 12.5 MG tablet Commonly known as: ANTIVERT Take 1 tablet (12.5 mg total) by mouth 2 (two) times daily as needed for dizziness.   pantoprazole 20 MG tablet Commonly known as: PROTONIX Take by mouth.   prednisoLONE acetate 1 % ophthalmic suspension Commonly known as: PRED FORTE SMARTSIG:In Eye(s)   rizatriptan 5 MG tablet Commonly known as: MAXALT Take by mouth.   simvastatin 20 MG tablet Commonly known as: ZOCOR Take 20 mg by mouth daily.   VITAMIN B-12 PO Take by mouth daily.        Allergies:  Allergies  Allergen Reactions   Topiramate Other (See Comments)    Orthostatic BP/Low blood pressure  Orthostatic BP/Low blood pressure     Advicor [Niacin-Lovastatin Er]    Niacin    Penicillins Rash    Family History: Family History  Problem Relation Age of Onset   Prostate cancer Father    Heart disease Mother     Social History:  reports that he has never smoked. He has never used smokeless tobacco. He reports that he does not drink alcohol and does not use drugs.   Physical Exam: Ht 5\' 7"  (1.702 m)   Wt 155 lb (70.3 kg)   BMI 24.28 kg/m   Constitutional:  Alert and oriented, No acute distress. HEENT: [place] AT, moist mucus membranes.  Trachea midline, no masses. Neurologic: Grossly intact, no focal deficits, moving all 4 extremities. Psychiatric: Normal mood and affect.   Assessment & Plan:    1. BPH with nocturia - Advised to restart gemtessa to manage urinary frequency and nocturia in light of worsening urinary symptoms.  Previously doing better  on beta 3 agonist.   - Continue finasteride to manage prostate size.  - If symptoms do not improve with gemtessa, consider other medications or procedures  Return in about 1 year (around 02/28/2024) for IPSS and PVR. Follow up sooner if symptoms persist or worsen.   I have reviewed the above documentation for accuracy and completeness, and I agree with the  above.   Vanna Scotland, MD  [facility] [address] [place], [place] [address] [phone]

## 2023-03-07 ENCOUNTER — Ambulatory Visit: Payer: PPO | Admitting: Dermatology

## 2023-03-09 DIAGNOSIS — L309 Dermatitis, unspecified: Secondary | ICD-10-CM | POA: Diagnosis not present

## 2023-03-12 DIAGNOSIS — I10 Essential (primary) hypertension: Secondary | ICD-10-CM | POA: Diagnosis not present

## 2023-03-12 DIAGNOSIS — Z125 Encounter for screening for malignant neoplasm of prostate: Secondary | ICD-10-CM | POA: Diagnosis not present

## 2023-03-12 DIAGNOSIS — E785 Hyperlipidemia, unspecified: Secondary | ICD-10-CM | POA: Diagnosis not present

## 2023-03-19 DIAGNOSIS — R748 Abnormal levels of other serum enzymes: Secondary | ICD-10-CM | POA: Diagnosis not present

## 2023-03-19 DIAGNOSIS — E782 Mixed hyperlipidemia: Secondary | ICD-10-CM | POA: Diagnosis not present

## 2023-03-19 DIAGNOSIS — N400 Enlarged prostate without lower urinary tract symptoms: Secondary | ICD-10-CM | POA: Diagnosis not present

## 2023-03-19 DIAGNOSIS — R5381 Other malaise: Secondary | ICD-10-CM | POA: Diagnosis not present

## 2023-03-19 DIAGNOSIS — Z125 Encounter for screening for malignant neoplasm of prostate: Secondary | ICD-10-CM | POA: Diagnosis not present

## 2023-03-19 DIAGNOSIS — I10 Essential (primary) hypertension: Secondary | ICD-10-CM | POA: Diagnosis not present

## 2023-03-19 DIAGNOSIS — Z1331 Encounter for screening for depression: Secondary | ICD-10-CM | POA: Diagnosis not present

## 2023-03-19 DIAGNOSIS — Z0000 Encounter for general adult medical examination without abnormal findings: Secondary | ICD-10-CM | POA: Diagnosis not present

## 2023-03-19 DIAGNOSIS — R5383 Other fatigue: Secondary | ICD-10-CM | POA: Diagnosis not present

## 2023-04-19 DIAGNOSIS — G43109 Migraine with aura, not intractable, without status migrainosus: Secondary | ICD-10-CM | POA: Diagnosis not present

## 2023-04-19 DIAGNOSIS — E538 Deficiency of other specified B group vitamins: Secondary | ICD-10-CM | POA: Diagnosis not present

## 2023-04-19 DIAGNOSIS — G930 Cerebral cysts: Secondary | ICD-10-CM | POA: Diagnosis not present

## 2023-05-04 DIAGNOSIS — B078 Other viral warts: Secondary | ICD-10-CM | POA: Diagnosis not present

## 2023-05-04 DIAGNOSIS — L820 Inflamed seborrheic keratosis: Secondary | ICD-10-CM | POA: Diagnosis not present

## 2023-05-04 DIAGNOSIS — D485 Neoplasm of uncertain behavior of skin: Secondary | ICD-10-CM | POA: Diagnosis not present

## 2023-05-04 DIAGNOSIS — L309 Dermatitis, unspecified: Secondary | ICD-10-CM | POA: Diagnosis not present

## 2023-05-04 DIAGNOSIS — L538 Other specified erythematous conditions: Secondary | ICD-10-CM | POA: Diagnosis not present

## 2023-05-16 DIAGNOSIS — I10 Essential (primary) hypertension: Secondary | ICD-10-CM | POA: Diagnosis not present

## 2023-05-16 DIAGNOSIS — I361 Nonrheumatic tricuspid (valve) insufficiency: Secondary | ICD-10-CM | POA: Diagnosis not present

## 2023-05-16 DIAGNOSIS — I340 Nonrheumatic mitral (valve) insufficiency: Secondary | ICD-10-CM | POA: Diagnosis not present

## 2023-05-16 DIAGNOSIS — E782 Mixed hyperlipidemia: Secondary | ICD-10-CM | POA: Diagnosis not present

## 2023-05-21 DIAGNOSIS — I340 Nonrheumatic mitral (valve) insufficiency: Secondary | ICD-10-CM | POA: Diagnosis not present

## 2023-05-21 DIAGNOSIS — I361 Nonrheumatic tricuspid (valve) insufficiency: Secondary | ICD-10-CM | POA: Diagnosis not present

## 2023-06-01 DIAGNOSIS — I10 Essential (primary) hypertension: Secondary | ICD-10-CM | POA: Diagnosis not present

## 2023-06-01 DIAGNOSIS — I361 Nonrheumatic tricuspid (valve) insufficiency: Secondary | ICD-10-CM | POA: Diagnosis not present

## 2023-06-01 DIAGNOSIS — E782 Mixed hyperlipidemia: Secondary | ICD-10-CM | POA: Diagnosis not present

## 2023-06-01 DIAGNOSIS — Z23 Encounter for immunization: Secondary | ICD-10-CM | POA: Diagnosis not present

## 2023-06-01 DIAGNOSIS — I340 Nonrheumatic mitral (valve) insufficiency: Secondary | ICD-10-CM | POA: Diagnosis not present

## 2023-06-25 DIAGNOSIS — H401123 Primary open-angle glaucoma, left eye, severe stage: Secondary | ICD-10-CM | POA: Diagnosis not present

## 2023-06-27 DIAGNOSIS — H35379 Puckering of macula, unspecified eye: Secondary | ICD-10-CM | POA: Diagnosis not present

## 2023-06-27 DIAGNOSIS — H401123 Primary open-angle glaucoma, left eye, severe stage: Secondary | ICD-10-CM | POA: Diagnosis not present

## 2023-06-27 DIAGNOSIS — H26491 Other secondary cataract, right eye: Secondary | ICD-10-CM | POA: Diagnosis not present

## 2023-07-25 DIAGNOSIS — G43109 Migraine with aura, not intractable, without status migrainosus: Secondary | ICD-10-CM | POA: Diagnosis not present

## 2023-07-25 DIAGNOSIS — E559 Vitamin D deficiency, unspecified: Secondary | ICD-10-CM | POA: Diagnosis not present

## 2023-09-12 DIAGNOSIS — Z125 Encounter for screening for malignant neoplasm of prostate: Secondary | ICD-10-CM | POA: Diagnosis not present

## 2023-09-12 DIAGNOSIS — R748 Abnormal levels of other serum enzymes: Secondary | ICD-10-CM | POA: Diagnosis not present

## 2023-10-04 DIAGNOSIS — E559 Vitamin D deficiency, unspecified: Secondary | ICD-10-CM | POA: Diagnosis not present

## 2023-10-04 DIAGNOSIS — E782 Mixed hyperlipidemia: Secondary | ICD-10-CM | POA: Diagnosis not present

## 2023-10-04 DIAGNOSIS — R0602 Shortness of breath: Secondary | ICD-10-CM | POA: Diagnosis not present

## 2023-10-31 DIAGNOSIS — G43119 Migraine with aura, intractable, without status migrainosus: Secondary | ICD-10-CM | POA: Diagnosis not present

## 2023-10-31 DIAGNOSIS — R5383 Other fatigue: Secondary | ICD-10-CM | POA: Diagnosis not present

## 2023-10-31 DIAGNOSIS — K5900 Constipation, unspecified: Secondary | ICD-10-CM | POA: Diagnosis not present

## 2023-10-31 DIAGNOSIS — R531 Weakness: Secondary | ICD-10-CM | POA: Diagnosis not present

## 2023-10-31 DIAGNOSIS — R946 Abnormal results of thyroid function studies: Secondary | ICD-10-CM | POA: Diagnosis not present

## 2023-10-31 DIAGNOSIS — R7989 Other specified abnormal findings of blood chemistry: Secondary | ICD-10-CM | POA: Diagnosis not present

## 2023-10-31 DIAGNOSIS — R5381 Other malaise: Secondary | ICD-10-CM | POA: Diagnosis not present

## 2023-10-31 DIAGNOSIS — I10 Essential (primary) hypertension: Secondary | ICD-10-CM | POA: Diagnosis not present

## 2023-10-31 DIAGNOSIS — R002 Palpitations: Secondary | ICD-10-CM | POA: Diagnosis not present

## 2023-11-01 DIAGNOSIS — R946 Abnormal results of thyroid function studies: Secondary | ICD-10-CM | POA: Diagnosis not present

## 2023-11-01 DIAGNOSIS — R5381 Other malaise: Secondary | ICD-10-CM | POA: Diagnosis not present

## 2023-11-01 DIAGNOSIS — I10 Essential (primary) hypertension: Secondary | ICD-10-CM | POA: Diagnosis not present

## 2023-11-01 DIAGNOSIS — R531 Weakness: Secondary | ICD-10-CM | POA: Diagnosis not present

## 2023-11-01 DIAGNOSIS — R7989 Other specified abnormal findings of blood chemistry: Secondary | ICD-10-CM | POA: Diagnosis not present

## 2023-11-01 DIAGNOSIS — R5383 Other fatigue: Secondary | ICD-10-CM | POA: Diagnosis not present

## 2023-11-01 DIAGNOSIS — R002 Palpitations: Secondary | ICD-10-CM | POA: Diagnosis not present

## 2023-11-01 DIAGNOSIS — G43119 Migraine with aura, intractable, without status migrainosus: Secondary | ICD-10-CM | POA: Diagnosis not present

## 2023-11-01 DIAGNOSIS — K5900 Constipation, unspecified: Secondary | ICD-10-CM | POA: Diagnosis not present

## 2023-11-07 DIAGNOSIS — E782 Mixed hyperlipidemia: Secondary | ICD-10-CM | POA: Diagnosis not present

## 2023-11-07 DIAGNOSIS — I340 Nonrheumatic mitral (valve) insufficiency: Secondary | ICD-10-CM | POA: Diagnosis not present

## 2023-11-07 DIAGNOSIS — I10 Essential (primary) hypertension: Secondary | ICD-10-CM | POA: Diagnosis not present

## 2023-11-14 ENCOUNTER — Emergency Department

## 2023-11-14 ENCOUNTER — Other Ambulatory Visit: Payer: Self-pay

## 2023-11-14 ENCOUNTER — Emergency Department
Admission: EM | Admit: 2023-11-14 | Discharge: 2023-11-14 | Disposition: A | Discharge status: Home / Self Care | Attending: Emergency Medicine | Admitting: Emergency Medicine

## 2023-11-14 DIAGNOSIS — J189 Pneumonia, unspecified organism: Secondary | ICD-10-CM | POA: Insufficient documentation

## 2023-11-14 DIAGNOSIS — R531 Weakness: Secondary | ICD-10-CM | POA: Diagnosis not present

## 2023-11-14 DIAGNOSIS — R0789 Other chest pain: Secondary | ICD-10-CM | POA: Diagnosis not present

## 2023-11-14 DIAGNOSIS — R918 Other nonspecific abnormal finding of lung field: Secondary | ICD-10-CM | POA: Diagnosis not present

## 2023-11-14 DIAGNOSIS — I10 Essential (primary) hypertension: Secondary | ICD-10-CM | POA: Diagnosis not present

## 2023-11-14 DIAGNOSIS — J929 Pleural plaque without asbestos: Secondary | ICD-10-CM | POA: Diagnosis not present

## 2023-11-14 DIAGNOSIS — I771 Stricture of artery: Secondary | ICD-10-CM | POA: Diagnosis not present

## 2023-11-14 DIAGNOSIS — R079 Chest pain, unspecified: Secondary | ICD-10-CM

## 2023-11-14 LAB — CBC
HCT: 46.6 % (ref 39.0–52.0)
Hemoglobin: 15.9 g/dL (ref 13.0–17.0)
MCH: 31.5 pg (ref 26.0–34.0)
MCHC: 34.1 g/dL (ref 30.0–36.0)
MCV: 92.3 fL (ref 80.0–100.0)
Platelets: 218 10*3/uL (ref 150–400)
RBC: 5.05 MIL/uL (ref 4.22–5.81)
RDW: 12.9 % (ref 11.5–15.5)
WBC: 5.9 10*3/uL (ref 4.0–10.5)
nRBC: 0 % (ref 0.0–0.2)

## 2023-11-14 LAB — BASIC METABOLIC PANEL WITH GFR
Anion gap: 7 (ref 5–15)
BUN: 17 mg/dL (ref 8–23)
CO2: 23 mmol/L (ref 22–32)
Calcium: 9 mg/dL (ref 8.9–10.3)
Chloride: 107 mmol/L (ref 98–111)
Creatinine, Ser: 1.05 mg/dL (ref 0.61–1.24)
GFR, Estimated: >60 mL/min (ref >60)
Glucose, Bld: 96 mg/dL (ref 70–99)
Potassium: 4 mmol/L (ref 3.5–5.1)
Sodium: 137 mmol/L (ref 135–145)

## 2023-11-14 LAB — HEPATIC FUNCTION PANEL
ALT: 14 U/L (ref 0–44)
AST: 21 U/L (ref 15–41)
Albumin: 3.4 g/dL — ABNORMAL LOW (ref 3.5–5.0)
Alkaline Phosphatase: 149 U/L — ABNORMAL HIGH (ref 38–126)
Bilirubin, Direct: 0.2 mg/dL (ref 0.0–0.2)
Indirect Bilirubin: 0.7 mg/dL (ref 0.3–0.9)
Total Bilirubin: 0.9 mg/dL (ref 0.0–1.2)
Total Protein: 6.5 g/dL (ref 6.5–8.1)

## 2023-11-14 LAB — TROPONIN I (HIGH SENSITIVITY)
Troponin I (High Sensitivity): 4 ng/L (ref <18)
Troponin I (High Sensitivity): 5 ng/L (ref <18)

## 2023-11-14 LAB — D-DIMER, QUANTITATIVE: D-Dimer, Quant: 0.49 ug{FEU}/mL (ref 0.00–0.50)

## 2023-11-14 LAB — LIPASE, BLOOD: Lipase: 31 U/L (ref 11–51)

## 2023-11-14 LAB — MAGNESIUM: Magnesium: 2 mg/dL (ref 1.7–2.4)

## 2023-11-14 MED ORDER — DOXYCYCLINE HYCLATE 100 MG PO TABS
100.0000 mg | ORAL_TABLET | Freq: Two times a day (BID) | ORAL | 0 refills | Status: AC
Start: 2023-11-14 — End: 2023-11-21

## 2023-11-14 MED ORDER — SODIUM CHLORIDE 0.9 % IV BOLUS
1000.0000 mL | Freq: Once | INTRAVENOUS | Status: AC
Start: 2023-11-14 — End: 2023-11-14
  Administered 2023-11-14: 1000 mL via INTRAVENOUS

## 2023-11-14 NOTE — ED Triage Notes (Addendum)
 Pt here with weakness and lightheadedness x2 weeks. Pt states he is also having left sided chest pain that radiates to his back. Pt describes the pain as tightness, also states it is intermittent. Pt states he just removed his external heart monitor last night and was supposed to have a follow up today but he felt worse. Pt stable in triage.

## 2023-11-14 NOTE — Discharge Instructions (Addendum)
 Your testing today was overall reassuring.  Your x-Enzley Kitchens was concerning for possible small areas of infection on your lung known as pneumonia.  I sent a prescription for antibiotics to your pharmacy.  Please take this as directed.  Follow with your primary care doctor for reevaluation with consideration of additional imaging.  Continue to follow-up with cardiology as directed.  Return to the ER for new or worsening symptoms.

## 2023-11-14 NOTE — ED Notes (Signed)
 Fall precautions taken. Fall bracelet applied to pt. Pt has on personal shoes at this time. Bed alarm on.

## 2023-11-14 NOTE — ED Provider Notes (Signed)
 [facility] Provider Note    Event Date/Time   First MD Initiated Contact with Patient 11/14/23 1117     (approximate)   History   Weakness   HPI  Adrian Mitchell is a 83 year old male with history of hypertension presenting to the emergency department for evaluation of weakness.  Patient reports for several weeks he has had ongoing intermittent episodes of weakness.  Has been following up with primary care doctor and cardiology.  Today, he was straining to have a bowel movement.  Shortly after had recurrent weakness.  Tried to eat breakfast, but had worsening weakness so he called his primary care office who recommended ER presentation.  Does report brief episode of dull chest pain around 9 AM this morning, now resolved.  I reviewed his cardiology visit from 11/07/2023.  At that time, patient was seen for further evaluation of hypertension and generalized weakness.  He was wearing a cardiac monitor at that time with plans for review when available.  There was consideration for possible dehydration contributing to his weakness.  Reports he took his monitor off today, has not yet submitted it.     Physical Exam   Triage Vital Signs: ED Triage Vitals  Encounter Vitals Group     BP 11/14/23 1033 (!) 150/73     Girls Systolic BP Percentile --      Girls Diastolic BP Percentile --      Boys Systolic BP Percentile --      Boys Diastolic BP Percentile --      Pulse Rate 11/14/23 1033 68     Resp 11/14/23 1033 18     Temp 11/14/23 1037 97.7 F (36.5 C)     Temp Source 11/14/23 1033 Oral     SpO2 11/14/23 1033 95 %     Weight 11/14/23 1034 154 lb 15.7 oz (70.3 kg)     Height 11/14/23 1034 5' 7 (1.702 m)     Head Circumference --      Peak Flow --      Pain Score --      Pain Loc --      Pain Education --      Exclude from Growth Chart --     Most recent vital signs: Vitals:   11/14/23 1230 11/14/23 1300  BP: 137/77 (!) 166/87  Pulse: (!) 56 79   Resp: 20 (!) 21  Temp:    SpO2: 99% 100%     General: Awake, interactive  CV:  Regular rate, good peripheral perfusion.  Resp:  Unlabored respirations, lungs clear to auscultation Abd:  Nondistended, soft, nontender Neuro:  Symmetric facial movement, fluid speech   ED Results / Procedures / Treatments   Labs (all labs ordered are listed, but only abnormal results are displayed) Labs Reviewed  HEPATIC FUNCTION PANEL - Abnormal; Notable for the following components:      Result Value   Albumin 3.4 (*)    Alkaline Phosphatase 149 (*)    All other components within normal limits  BASIC METABOLIC PANEL WITH GFR  CBC  MAGNESIUM  LIPASE, BLOOD  D-DIMER, QUANTITATIVE (NOT AT [facility])  TROPONIN I (HIGH SENSITIVITY)  TROPONIN I (HIGH SENSITIVITY)     EKG EKG independently reviewed and interpreted by myself demonstrates:  EKG demonstrates sinus rhythm at a rate of 63, PR 206, QRS 80, QTc 409, multiple PVCs noted  RADIOLOGY Imaging independently reviewed and interpreted by myself demonstrates:  Chest x-Robert Sperl with no obvious focal consolidation  on my review, radiology notes subtle lung opacities at the right upper lung and left midlung.  Formal Radiology Read:  DG Chest 2 View Result Date: 11/14/2023 CLINICAL DATA:  Chest pain EXAM: CHEST - 2 VIEW COMPARISON:  X-Saraia Platner 05/02/2021.  CT 06/13/2021 FINDINGS: Hyperinflation. Normal cardiopericardial silhouette. Tortuous ectatic aorta. Cul pleural thickening. No pneumothorax or effusion. No edema. Subtle opacities identified at the right upper lung and left midlung. IMPRESSION: Hyperinflation. Subtle lung opacities. Recommend either short follow-up versus CT scan to further delineate. Electronically Signed   By: Adrianna Horde M.D.   On: 11/14/2023 12:34    PROCEDURES:  Critical Care performed: No  Procedures   MEDICATIONS ORDERED IN ED: Medications  sodium chloride 0.9 % bolus 1,000 mL (0 mLs Intravenous Stopped 11/14/23 1318)      IMPRESSION / MDM / ASSESSMENT AND PLAN / ED COURSE  I reviewed the triage vital signs and the nursing notes.  Differential diagnosis includes, but is not limited to, anemia, electrolyte abnormality, dehydration, ACS, PE, low suspicion aortic dissection given resolution of symptoms, pancreatitis, gastritis  Patient's presentation is most consistent with acute presentation with potential threat to life or bodily function.  83 year old male presenting with weakness, now resolved episode of chest pain.  Stable vitals on presentation.  Labs with triage reassuring.  Will add on D-dimer, mag in the setting of PVCs, LFTs, lipase with consideration for abdominal etiology of chest pain.  Will also trial IV fluid resuscitation given consideration for dehydration noted by cardiology.  D-dimer returned within normal limits.  Negative troponin x 2.  LFTs, lipase, magnesium within normal limits.  X-Dariah Mcsorley was interpreted by radiology as subtle opacities.  The patient does report some increasing weakness, could be related to mild pneumonia.  No evidence of sepsis, but I did discuss discharge with outpatient antibiotics and patient is comfortable with this plan.  Does feel improved after receiving fluids here.  Strict return precautions provided.  Patient discharged in stable condition.      FINAL CLINICAL IMPRESSION(S) / ED DIAGNOSES   Final diagnoses:  Generalized weakness  Nonspecific chest pain  Community acquired pneumonia, unspecified laterality     Rx / DC Orders   ED Discharge Orders          Ordered    doxycycline  (VIBRA -TABS) 100 MG tablet  2 times daily        11/14/23 1402             Note:  This document was prepared using Dragon voice recognition software and may include unintentional dictation errors.   Claria Crofts, MD 11/14/23 [phone]

## 2023-11-19 DIAGNOSIS — R5381 Other malaise: Secondary | ICD-10-CM | POA: Diagnosis not present

## 2023-11-19 DIAGNOSIS — R531 Weakness: Secondary | ICD-10-CM | POA: Diagnosis not present

## 2023-11-19 DIAGNOSIS — E538 Deficiency of other specified B group vitamins: Secondary | ICD-10-CM | POA: Diagnosis not present

## 2023-11-19 DIAGNOSIS — R918 Other nonspecific abnormal finding of lung field: Secondary | ICD-10-CM | POA: Diagnosis not present

## 2023-11-19 DIAGNOSIS — R5383 Other fatigue: Secondary | ICD-10-CM | POA: Diagnosis not present

## 2023-11-19 DIAGNOSIS — Z8701 Personal history of pneumonia (recurrent): Secondary | ICD-10-CM | POA: Diagnosis not present

## 2023-11-22 ENCOUNTER — Encounter: Payer: Self-pay | Admitting: Family Medicine

## 2023-11-24 ENCOUNTER — Emergency Department

## 2023-11-24 ENCOUNTER — Emergency Department
Admission: EM | Admit: 2023-11-24 | Discharge: 2023-11-27 | Disposition: A | Discharge status: Home / Self Care | Attending: Emergency Medicine | Admitting: Emergency Medicine

## 2023-11-24 ENCOUNTER — Other Ambulatory Visit: Payer: Self-pay

## 2023-11-24 DIAGNOSIS — K5900 Constipation, unspecified: Secondary | ICD-10-CM | POA: Diagnosis not present

## 2023-11-24 DIAGNOSIS — I10 Essential (primary) hypertension: Secondary | ICD-10-CM | POA: Diagnosis not present

## 2023-11-24 DIAGNOSIS — K5641 Fecal impaction: Secondary | ICD-10-CM | POA: Insufficient documentation

## 2023-11-24 MED ORDER — LIDOCAINE HCL URETHRAL/MUCOSAL 2 % EX GEL
1.0000 | Freq: Once | CUTANEOUS | Status: AC
  Administered 2023-11-24: 1
  Filled 2023-11-24: qty 6

## 2023-11-24 MED ORDER — LIDOCAINE VISCOUS HCL 2 % MT SOLN
15.0000 mL | Freq: Once | OROMUCOSAL | Status: DC
  Filled 2023-11-24: qty 15

## 2023-11-24 NOTE — Discharge Instructions (Signed)
 Use Miralax 1 cup twice daily and you can still take the metamucil Return to the ER if worsening If you are constipated you can use 1 cup of miralax every 2 hours until you are able to have a bowel movement

## 2023-11-24 NOTE — ED Notes (Signed)
 Patient has passed 4-5 small to medium balls of stool. Wife is at bedside. Patient is able to independently transfer from stretcher to commode. Approximately 1/4 of enema left in bag. Enema has been administered in increments as the patient can tolerate.

## 2023-11-24 NOTE — ED Notes (Signed)
 Patient would like PA to try disimpacting again.

## 2023-11-24 NOTE — ED Triage Notes (Signed)
 Pt here via [facility] with constipation, last bowel movement was 3 days ago. Pt states he has been using several laxatives with no success. Pt endorses pressure in his rectum.

## 2023-11-24 NOTE — ED Triage Notes (Signed)
 First nurse note: Pt reports constipation with last BM 4 days ago. Pt with hx of HTN and no meds this morning   155/87 97% RA HR 76

## 2023-11-24 NOTE — ED Provider Notes (Signed)
 [facility] Provider Note    Event Date/Time   First MD Initiated Contact with Patient 11/24/23 1013     (approximate)   History   Constipation   HPI  Adrian Mitchell is a 83 y.o. male tree of glaucoma, hyperlipidemia, ED presents emergency department with constipation.  Last bowel movement was 3 days ago.  He has several laxatives and a fleets enema at home without any relief.  Has a lot of pressure in the rectum.  No fever, chills or abdominal pain      Physical Exam   Triage Vital Signs: ED Triage Vitals  Encounter Vitals Group     BP 11/24/23 0927 139/81     Girls Systolic BP Percentile --      Girls Diastolic BP Percentile --      Boys Systolic BP Percentile --      Boys Diastolic BP Percentile --      Pulse Rate 11/24/23 0927 86     Resp 11/24/23 0927 18     Temp 11/24/23 0928 98.3 F (36.8 C)     Temp Source 11/24/23 0928 Oral     SpO2 11/24/23 0927 99 %     Weight 11/24/23 0928 154 lb 15.7 oz (70.3 kg)     Height 11/24/23 0928 5' 7 (1.702 m)     Head Circumference --      Peak Flow --      Pain Score --      Pain Loc --      Pain Education --      Exclude from Growth Chart --     Most recent vital signs: Vitals:   11/24/23 0928 11/24/23 1453  BP:  132/65  Pulse:  92  Resp:  18  Temp: 98.3 F (36.8 C)   SpO2:  98%     General: Awake, no distress.   CV:  Good peripheral perfusion. Resp:  Normal effort.  Abd:  No distention.   Other:  Rectal exam shows a few hemorrhoids externally, some loose stool noted exteriorly, rectal exam shows stool in the vault, I did disimpact him   ED Results / Procedures / Treatments   Labs (all labs ordered are listed, but only abnormal results are displayed) Labs Reviewed - No data to display   EKG     RADIOLOGY X-ray of the abdomen 1 view    PROCEDURES:   .Fecal disimpaction  Date/Time: 11/24/2023 11:53 AM  Performed by: Gasper Devere ORN, PA-C Authorized by: Gasper Devere ORN, PA-C  Consent: Verbal consent obtained Consent given by: patient Patient understanding: patient states understanding of the procedure being performed Imaging studies: imaging studies available Patient identity confirmed: verbally with patient Time out: Immediately prior to procedure a time out was called to verify the correct patient, procedure, equipment, support staff and site/side marked as required. Local anesthesia used: no  Anesthesia: Local anesthesia used: no  Sedation: Patient sedated: no  Patient tolerance: patient tolerated the procedure well with no immediate complications Comments: Small amount of stool within the vault, unable to reach remainder, will do a enema      Critical Care:   Chief Complaint  Patient presents with   Constipation      MEDICATIONS ORDERED IN ED: Medications  lidocaine  (XYLOCAINE ) 2 % jelly 1 Application (1 Application Other Given 11/24/23 1305)     IMPRESSION / MDM / ASSESSMENT AND PLAN / ED COURSE  I reviewed the triage vital signs and  the nursing notes.                              Differential diagnosis includes, but is not limited to, constipation, bowel obstruction, diverticulitis  Patient's presentation is most consistent with acute illness / injury with system symptoms.   Medications given: Soapsuds enema  X-ray of the abdomen 1 view independently reviewed interpreted by me as having some stool in the rectum  See procedure note for disimpaction  Patient had some relief with soapsuds enema, feels more pressure along the rectum is unable to push the rest of the stool out.  Second disimpaction.  Did get some soft stool out.  Patient appears to be very nervous about whether we have gotten it all out so did repeat a abdomen x-ray which appears to have an improved gas pattern.  Do not see a stool ball.,  Radiology is read as nonobstructive bowel pattern  The patient is continue MiraLAX, Metamucil.  Follow-up with his  regular doctor.  Return emergency department worsening.  He is in agreement with this treatment plan.  He is discharged stable condition.      FINAL CLINICAL IMPRESSION(S) / ED DIAGNOSES   Final diagnoses:  Fecal impaction in rectum [name])     Rx / DC Orders   ED Discharge Orders     None        Note:  This document was prepared using Dragon voice recognition software and may include unintentional dictation errors.    Gasper Devere ORN, PA-C 11/24/23 1500    Adrian Charleston, MD 11/24/23 [phone]

## 2023-11-27 ENCOUNTER — Other Ambulatory Visit: Payer: Self-pay | Admitting: Family Medicine

## 2023-11-27 DIAGNOSIS — R531 Weakness: Secondary | ICD-10-CM

## 2023-11-27 DIAGNOSIS — R918 Other nonspecific abnormal finding of lung field: Secondary | ICD-10-CM

## 2023-11-27 DIAGNOSIS — K5641 Fecal impaction: Secondary | ICD-10-CM | POA: Diagnosis not present

## 2023-11-27 DIAGNOSIS — K5900 Constipation, unspecified: Secondary | ICD-10-CM | POA: Diagnosis not present

## 2023-11-27 DIAGNOSIS — Z8701 Personal history of pneumonia (recurrent): Secondary | ICD-10-CM

## 2023-12-04 ENCOUNTER — Other Ambulatory Visit: Payer: Self-pay | Admitting: Gastroenterology

## 2023-12-04 DIAGNOSIS — R1084 Generalized abdominal pain: Secondary | ICD-10-CM

## 2023-12-04 DIAGNOSIS — R42 Dizziness and giddiness: Secondary | ICD-10-CM | POA: Diagnosis not present

## 2023-12-04 DIAGNOSIS — R194 Change in bowel habit: Secondary | ICD-10-CM

## 2023-12-06 ENCOUNTER — Ambulatory Visit
Admission: RE | Admit: 2023-12-06 | Discharge: 2023-12-06 | Disposition: A | Discharge status: Home / Self Care | Source: Ambulatory Visit | Attending: Family Medicine | Admitting: Family Medicine

## 2023-12-06 DIAGNOSIS — J479 Bronchiectasis, uncomplicated: Secondary | ICD-10-CM | POA: Diagnosis not present

## 2023-12-06 DIAGNOSIS — R1084 Generalized abdominal pain: Secondary | ICD-10-CM | POA: Diagnosis not present

## 2023-12-06 DIAGNOSIS — R194 Change in bowel habit: Secondary | ICD-10-CM | POA: Diagnosis not present

## 2023-12-06 DIAGNOSIS — Z8701 Personal history of pneumonia (recurrent): Secondary | ICD-10-CM | POA: Diagnosis not present

## 2023-12-06 DIAGNOSIS — R918 Other nonspecific abnormal finding of lung field: Secondary | ICD-10-CM | POA: Diagnosis not present

## 2023-12-06 DIAGNOSIS — R531 Weakness: Secondary | ICD-10-CM | POA: Insufficient documentation

## 2023-12-06 MED ORDER — IOHEXOL 300 MG/ML  SOLN
100.0000 mL | Freq: Once | INTRAMUSCULAR | Status: AC | PRN
  Administered 2023-12-06: 100 mL via INTRAVENOUS

## 2023-12-10 ENCOUNTER — Encounter: Payer: Self-pay | Admitting: Family Medicine

## 2023-12-10 DIAGNOSIS — B259 Cytomegaloviral disease, unspecified: Secondary | ICD-10-CM | POA: Diagnosis not present

## 2023-12-10 DIAGNOSIS — R918 Other nonspecific abnormal finding of lung field: Secondary | ICD-10-CM | POA: Diagnosis not present

## 2023-12-10 DIAGNOSIS — E039 Hypothyroidism, unspecified: Secondary | ICD-10-CM | POA: Diagnosis not present

## 2023-12-10 DIAGNOSIS — R531 Weakness: Secondary | ICD-10-CM | POA: Diagnosis not present

## 2023-12-10 DIAGNOSIS — R5383 Other fatigue: Secondary | ICD-10-CM | POA: Diagnosis not present

## 2023-12-10 DIAGNOSIS — R5381 Other malaise: Secondary | ICD-10-CM | POA: Diagnosis not present

## 2023-12-10 DIAGNOSIS — E538 Deficiency of other specified B group vitamins: Secondary | ICD-10-CM | POA: Diagnosis not present

## 2023-12-12 DIAGNOSIS — K5900 Constipation, unspecified: Secondary | ICD-10-CM | POA: Diagnosis not present

## 2023-12-18 DIAGNOSIS — R918 Other nonspecific abnormal finding of lung field: Secondary | ICD-10-CM | POA: Diagnosis not present

## 2023-12-18 DIAGNOSIS — J479 Bronchiectasis, uncomplicated: Secondary | ICD-10-CM | POA: Diagnosis not present

## 2023-12-24 ENCOUNTER — Other Ambulatory Visit: Payer: Self-pay | Admitting: Pulmonary Disease

## 2023-12-24 DIAGNOSIS — R918 Other nonspecific abnormal finding of lung field: Secondary | ICD-10-CM

## 2023-12-24 DIAGNOSIS — J479 Bronchiectasis, uncomplicated: Secondary | ICD-10-CM

## 2023-12-27 ENCOUNTER — Inpatient Hospital Stay: Admission: RE | Admit: 2023-12-27 | Source: Ambulatory Visit

## 2023-12-28 DIAGNOSIS — R1030 Lower abdominal pain, unspecified: Secondary | ICD-10-CM | POA: Diagnosis not present

## 2023-12-28 DIAGNOSIS — K5904 Chronic idiopathic constipation: Secondary | ICD-10-CM | POA: Diagnosis not present

## 2024-01-02 DIAGNOSIS — H401112 Primary open-angle glaucoma, right eye, moderate stage: Secondary | ICD-10-CM | POA: Diagnosis not present

## 2024-01-04 ENCOUNTER — Ambulatory Visit: Admit: 2024-01-04 | Admitting: Pulmonary Disease

## 2024-01-04 SURGERY — VIDEO BRONCHOSCOPY WITH ENDOBRONCHIAL NAVIGATION
Anesthesia: General

## 2024-01-07 DIAGNOSIS — E039 Hypothyroidism, unspecified: Secondary | ICD-10-CM | POA: Diagnosis not present

## 2024-01-07 DIAGNOSIS — E538 Deficiency of other specified B group vitamins: Secondary | ICD-10-CM | POA: Diagnosis not present

## 2024-01-07 DIAGNOSIS — R5383 Other fatigue: Secondary | ICD-10-CM | POA: Diagnosis not present

## 2024-01-07 DIAGNOSIS — R5381 Other malaise: Secondary | ICD-10-CM | POA: Diagnosis not present

## 2024-01-14 DIAGNOSIS — R5381 Other malaise: Secondary | ICD-10-CM | POA: Diagnosis not present

## 2024-01-14 DIAGNOSIS — R5383 Other fatigue: Secondary | ICD-10-CM | POA: Diagnosis not present

## 2024-01-14 DIAGNOSIS — E039 Hypothyroidism, unspecified: Secondary | ICD-10-CM | POA: Diagnosis not present

## 2024-01-14 DIAGNOSIS — K5900 Constipation, unspecified: Secondary | ICD-10-CM | POA: Diagnosis not present

## 2024-01-14 DIAGNOSIS — G43909 Migraine, unspecified, not intractable, without status migrainosus: Secondary | ICD-10-CM | POA: Diagnosis not present

## 2024-01-14 DIAGNOSIS — E785 Hyperlipidemia, unspecified: Secondary | ICD-10-CM | POA: Diagnosis not present

## 2024-01-22 DIAGNOSIS — R5383 Other fatigue: Secondary | ICD-10-CM | POA: Diagnosis not present

## 2024-01-22 DIAGNOSIS — G43109 Migraine with aura, not intractable, without status migrainosus: Secondary | ICD-10-CM | POA: Diagnosis not present

## 2024-02-11 DIAGNOSIS — E538 Deficiency of other specified B group vitamins: Secondary | ICD-10-CM | POA: Diagnosis not present

## 2024-02-13 DIAGNOSIS — I10 Essential (primary) hypertension: Secondary | ICD-10-CM | POA: Diagnosis not present

## 2024-02-13 DIAGNOSIS — I340 Nonrheumatic mitral (valve) insufficiency: Secondary | ICD-10-CM | POA: Diagnosis not present

## 2024-02-13 DIAGNOSIS — E782 Mixed hyperlipidemia: Secondary | ICD-10-CM | POA: Diagnosis not present

## 2024-02-13 DIAGNOSIS — R0602 Shortness of breath: Secondary | ICD-10-CM | POA: Diagnosis not present

## 2024-02-27 ENCOUNTER — Ambulatory Visit (INDEPENDENT_AMBULATORY_CARE_PROVIDER_SITE_OTHER): Payer: Self-pay | Admitting: Physician Assistant

## 2024-02-27 VITALS — BP 128/81 | HR 78 | Wt 154.0 lb

## 2024-02-27 DIAGNOSIS — N401 Enlarged prostate with lower urinary tract symptoms: Secondary | ICD-10-CM

## 2024-02-27 DIAGNOSIS — R3914 Feeling of incomplete bladder emptying: Secondary | ICD-10-CM | POA: Diagnosis not present

## 2024-02-27 LAB — BLADDER SCAN AMB NON-IMAGING

## 2024-02-27 MED ORDER — FINASTERIDE 5 MG PO TABS: 5.0000 mg | ORAL_TABLET | Freq: Every day | ORAL | 3 refills | Status: AC

## 2024-02-27 NOTE — Progress Notes (Signed)
02/27/2024 10:05 AM   Adrian Mitchell 08-07-1940 [id number]  CC: Chief Complaint  Patient presents with   Benign Prostatic Hypertrophy   HPI: Adrian Mitchell is a 83 y.o. male with PMH BPH with nocturia on finasteride  and Gemtesa  and elevated PSA with negative biopsy in 2011 who presents today for annual follow-up.   Today he reports his urinary symptoms have been largely stable.  He has stopped Gemtesa  and reports nocturia x 3, but this is not particularly bothersome for him.  This year he has been struggling with constipation, which has required disimpaction x 2.  He is now on Linzess and supplements this with Metamucil, Dulcolax, MiraLAX and/or suppositories as needed.  He has not noticed any change in his voiding symptoms associated with constipation.  He had a CTAP with contrast on 12/06/2023 with no significant urologic findings.  Bladder does not appear particularly distended.  IPSS 8/mixed as below.  PVR 171 mL.   IPSS     Row Name 02/27/24 1000         International Prostate Symptom Score   How often have you had the sensation of not emptying your bladder? Less than half the time     How often have you found it difficult to postpone urination? Less than 1 in 5 times     How often have you had a weak urinary stream? Less than half the time     How often have you had to strain to start urination? Less than 1 in 5 times     How many times did you typically get up at night to urinate? 2 Times     Total IPSS Score 8       Quality of Life due to urinary symptoms   If you were to spend the rest of your life with your urinary condition just the way it is now how would you feel about that? Mixed         PMH: Past Medical History:  Diagnosis Date   ED (erectile dysfunction)    Glaucoma    Heart murmur    History of shingles    HLD (hyperlipidemia)    Migraine    none for over 3 months   Postherpetic neuralgia    Wears dentures    full upper and lower    Surgical  History: Past Surgical History:  Procedure Laterality Date   CATARACT EXTRACTION W/PHACO Right 07/23/2019   Procedure: CATARACT EXTRACTION PHACO AND INTRAOCULAR LENS PLACEMENT (IOC) RIGHT 6.92 00:51.1 13.5%;  Surgeon: Mittie Gaskin, MD;  Location: [facility];  Service: Ophthalmology;  Laterality: Right;   COLONOSCOPY  2013   CYSTOSCOPY  2016   HERNIA REPAIR     72yrs ago    Home Medications:  Allergies as of 02/27/2024       Reactions   Topiramate Other (See Comments)   Orthostatic BP/Low blood pressure  Orthostatic BP/Low blood pressure    Advicor [niacin-lovastatin Er]    Niacin    Penicillins Rash        Medication List        Accurate as of February 27, 2024 10:05 AM. If you have any questions, ask your nurse or doctor.          amLODipine 2.5 MG tablet Commonly known as: NORVASC   aspirin 81 MG tablet Take 81 mg by mouth 2 (two) times daily.   aspirin-sod bicarb-citric acid 325 MG Tbef tablet Commonly known as: ALKA-SELTZER Take  325 mg by mouth every 6 (six) hours as needed.   brimonidine  0.2 % ophthalmic solution Commonly known as: ALPHAGAN  SMARTSIG:In Eye(s)   butalbital-acetaminophen-caffeine 50-325-40 MG tablet Commonly known as: FIORICET Take 1 tablet by mouth 2 (two) times daily as needed for headache.   dorzolamide-timolol  2-0.5 % ophthalmic solution Commonly known as: COSOPT Place 1 drop into both eyes 2 (two) times daily.   doxycycline  100 MG capsule Commonly known as: MONODOX  Take 1 capsule (100 mg total) by mouth daily with supper.   finasteride  5 MG tablet Commonly known as: PROSCAR  Take 1 tablet (5 mg total) by mouth daily.   Gemtesa  75 MG Tabs Generic drug: Vibegron  Take 1 tablet by mouth daily.   Ivermectin  1 % Crea Commonly known as: Soolantra  Apply to face QHS   latanoprost 0.005 % ophthalmic solution Commonly known as: XALATAN 1 drop nightly   meclizine  12.5 MG tablet Commonly known as: ANTIVERT  Take 1  tablet (12.5 mg total) by mouth 2 (two) times daily as needed for dizziness.   pantoprazole 20 MG tablet Commonly known as: PROTONIX Take by mouth.   prednisoLONE acetate 1 % ophthalmic suspension Commonly known as: PRED FORTE SMARTSIG:In Eye(s)   rizatriptan 5 MG tablet Commonly known as: MAXALT Take by mouth.   simvastatin 20 MG tablet Commonly known as: ZOCOR Take 20 mg by mouth daily.   VITAMIN B-12 PO Take by mouth daily.        Allergies:  Allergies  Allergen Reactions   Topiramate Other (See Comments)    Orthostatic BP/Low blood pressure  Orthostatic BP/Low blood pressure     Advicor [Niacin-Lovastatin Er]    Niacin    Penicillins Rash    Family History: Family History  Problem Relation Age of Onset   Prostate cancer Father    Heart disease Mother     Social History:   reports that he has never smoked. He has never used smokeless tobacco. He reports that he does not drink alcohol and does not use drugs.  Physical Exam: BP 128/81 (BP Location: Left Arm, Patient Position: Sitting, Cuff Size: Normal)   Pulse 78   Wt 154 lb (69.9 kg)   SpO2 97%   BMI 24.12 kg/m   Constitutional:  Alert and oriented, no acute distress, nontoxic appearing HEENT: [place], AT Cardiovascular: No clubbing, cyanosis, or edema Respiratory: Normal respiratory effort, no increased work of breathing GI: Abdomen is soft, nontender, nondistended, no abdominal masses GU: No CVA tenderness Lymph: No cervical or inguinal lymphadenopathy Skin: No rashes, bruises or suspicious lesions Neurologic: Grossly intact, no focal deficits, moving all 4 extremities Psychiatric: Normal mood and affect  Laboratory Data: Results for orders placed or performed in visit on 02/27/24  Bladder Scan (Post Void Residual) in office   Collection Time: 02/27/24 10:04 AM  Result Value Ref Range   Scan Result    Assessment & Plan:   1. Benign prostatic hyperplasia with incomplete bladder emptying  (Primary) Voiding symptoms stable.  Some nocturia, not bothersome.  Okay to stay off Gemtesa .  Bladder scan is slightly elevated today, will continue to monitor closely.  Agree with continued bowel regimen. - Bladder Scan (Post Void Residual) in office - finasteride  (PROSCAR ) 5 MG tablet; Take 1 tablet (5 mg total) by mouth daily.  Dispense: 90 tablet; Refill: 3   Return in about 1 year (around 02/26/2025) for Annual IPSS/PVR.  Lucie Hones, PA-C   [facility] [address], [address] [place], [place] [address] [phone]  

## 2024-02-28 DIAGNOSIS — H401123 Primary open-angle glaucoma, left eye, severe stage: Secondary | ICD-10-CM | POA: Diagnosis not present

## 2024-02-28 DIAGNOSIS — H401112 Primary open-angle glaucoma, right eye, moderate stage: Secondary | ICD-10-CM | POA: Diagnosis not present

## 2024-02-28 DIAGNOSIS — H1820 Unspecified corneal edema: Secondary | ICD-10-CM | POA: Diagnosis not present

## 2024-03-06 DIAGNOSIS — K5909 Other constipation: Secondary | ICD-10-CM | POA: Diagnosis not present

## 2024-03-11 DIAGNOSIS — E039 Hypothyroidism, unspecified: Secondary | ICD-10-CM | POA: Diagnosis not present

## 2024-03-12 DIAGNOSIS — R5381 Other malaise: Secondary | ICD-10-CM | POA: Diagnosis not present

## 2024-03-12 DIAGNOSIS — R5383 Other fatigue: Secondary | ICD-10-CM | POA: Diagnosis not present

## 2024-03-17 DIAGNOSIS — R5383 Other fatigue: Secondary | ICD-10-CM | POA: Diagnosis not present

## 2024-03-17 DIAGNOSIS — E039 Hypothyroidism, unspecified: Secondary | ICD-10-CM | POA: Diagnosis not present

## 2024-03-17 DIAGNOSIS — G930 Cerebral cysts: Secondary | ICD-10-CM | POA: Diagnosis not present

## 2024-03-17 DIAGNOSIS — R5381 Other malaise: Secondary | ICD-10-CM | POA: Diagnosis not present

## 2024-03-17 DIAGNOSIS — I10 Essential (primary) hypertension: Secondary | ICD-10-CM | POA: Diagnosis not present

## 2024-03-17 DIAGNOSIS — R918 Other nonspecific abnormal finding of lung field: Secondary | ICD-10-CM | POA: Diagnosis not present

## 2024-03-17 DIAGNOSIS — Z0000 Encounter for general adult medical examination without abnormal findings: Secondary | ICD-10-CM | POA: Diagnosis not present

## 2024-03-17 DIAGNOSIS — G43119 Migraine with aura, intractable, without status migrainosus: Secondary | ICD-10-CM | POA: Diagnosis not present

## 2024-03-17 DIAGNOSIS — E782 Mixed hyperlipidemia: Secondary | ICD-10-CM | POA: Diagnosis not present

## 2024-03-17 DIAGNOSIS — R42 Dizziness and giddiness: Secondary | ICD-10-CM | POA: Diagnosis not present

## 2024-03-19 ENCOUNTER — Other Ambulatory Visit: Payer: Self-pay | Admitting: Family Medicine

## 2024-03-19 DIAGNOSIS — R918 Other nonspecific abnormal finding of lung field: Secondary | ICD-10-CM

## 2024-03-19 DIAGNOSIS — Z0000 Encounter for general adult medical examination without abnormal findings: Secondary | ICD-10-CM

## 2024-03-19 DIAGNOSIS — G930 Cerebral cysts: Secondary | ICD-10-CM

## 2024-03-21 ENCOUNTER — Ambulatory Visit
Admission: RE | Admit: 2024-03-21 | Discharge: 2024-03-21 | Disposition: A | Discharge status: Home / Self Care | Source: Ambulatory Visit | Attending: Family Medicine | Admitting: Family Medicine

## 2024-03-21 ENCOUNTER — Other Ambulatory Visit: Payer: Self-pay

## 2024-03-21 ENCOUNTER — Observation Stay
Admission: EM | Admit: 2024-03-21 | Discharge: 2024-03-21 | Disposition: A | Discharge status: Home / Self Care | Source: Ambulatory Visit | Attending: Emergency Medicine | Admitting: Emergency Medicine

## 2024-03-21 DIAGNOSIS — I639 Cerebral infarction, unspecified: Principal | ICD-10-CM | POA: Diagnosis present

## 2024-03-21 DIAGNOSIS — I6389 Other cerebral infarction: Secondary | ICD-10-CM | POA: Diagnosis not present

## 2024-03-21 DIAGNOSIS — R9089 Other abnormal findings on diagnostic imaging of central nervous system: Secondary | ICD-10-CM | POA: Diagnosis not present

## 2024-03-21 DIAGNOSIS — I6782 Cerebral ischemia: Secondary | ICD-10-CM | POA: Diagnosis not present

## 2024-03-21 DIAGNOSIS — G930 Cerebral cysts: Secondary | ICD-10-CM | POA: Diagnosis not present

## 2024-03-21 DIAGNOSIS — N400 Enlarged prostate without lower urinary tract symptoms: Secondary | ICD-10-CM | POA: Insufficient documentation

## 2024-03-21 DIAGNOSIS — I10 Essential (primary) hypertension: Secondary | ICD-10-CM | POA: Diagnosis present

## 2024-03-21 DIAGNOSIS — G43909 Migraine, unspecified, not intractable, without status migrainosus: Secondary | ICD-10-CM | POA: Diagnosis present

## 2024-03-21 LAB — COMPREHENSIVE METABOLIC PANEL WITH GFR
ALT: 14 U/L (ref 0–44)
AST: 23 U/L (ref 15–41)
Albumin: 3.6 g/dL (ref 3.5–5.0)
Alkaline Phosphatase: 146 U/L — ABNORMAL HIGH (ref 38–126)
Anion gap: 8 (ref 5–15)
BUN: 17 mg/dL (ref 8–23)
CO2: 24 mmol/L (ref 22–32)
Calcium: 8.9 mg/dL (ref 8.9–10.3)
Chloride: 107 mmol/L (ref 98–111)
Creatinine, Ser: 0.98 mg/dL (ref 0.61–1.24)
GFR, Estimated: >60 mL/min (ref >60)
Glucose, Bld: 97 mg/dL (ref 70–99)
Potassium: 4 mmol/L (ref 3.5–5.1)
Sodium: 139 mmol/L (ref 135–145)
Total Bilirubin: 0.8 mg/dL (ref 0.0–1.2)
Total Protein: 6.6 g/dL (ref 6.5–8.1)

## 2024-03-21 LAB — CBC
HCT: 46.2 % (ref 39.0–52.0)
Hemoglobin: 15.6 g/dL (ref 13.0–17.0)
MCH: 31.6 pg (ref 26.0–34.0)
MCHC: 33.8 g/dL (ref 30.0–36.0)
MCV: 93.5 fL (ref 80.0–100.0)
Platelets: 256 K/uL (ref 150–400)
RBC: 4.94 MIL/uL (ref 4.22–5.81)
RDW: 13.1 % (ref 11.5–15.5)
WBC: 7.4 K/uL (ref 4.0–10.5)
nRBC: 0 % (ref 0.0–0.2)

## 2024-03-21 MED ORDER — CLOPIDOGREL BISULFATE 75 MG PO TABS: 75.0000 mg | ORAL_TABLET | Freq: Every day | ORAL | 0 refills | Status: AC

## 2024-03-21 NOTE — ED Provider Notes (Signed)
 [facility] Provider Note    Event Date/Time   First MD Initiated Contact with Patient 03/21/24 1354     (approximate)   History   Abnormal MRI   HPI  Adrian Mitchell is a 83 y.o. male with a history of hypertension, chronic headaches, hyperlipidemia, and ED who presents with an abnormal MRI.  The patient denies any acute symptoms.  He states that he saw his doctor earlier this week and was ordered for an MRI to monitor an arachnoid cyst that he has.  He states that today after the MRI he was told to come to the hospital and that he may have had a stroke.  The patient denies any weakness or numbness, dizziness, balance problems, vision changes, speech difficulty, or any other acute symptoms.  He states that he was mowing his lawn yesterday without any difficulty.  He is on a daily aspirin.  I reviewed the past medical records.  The patient was last seen by family medicine on 10/20 for an annual wellness visit.  He reported intermittent dizziness at that time and was ordered for the MRI to evaluate for an arachnoid cyst.  MRI read shows an acute left cerebellar infarct.   Physical Exam   Triage Vital Signs: ED Triage Vitals  Encounter Vitals Group     BP 03/21/24 1342 (!) 166/102     Girls Systolic BP Percentile --      Girls Diastolic BP Percentile --      Boys Systolic BP Percentile --      Boys Diastolic BP Percentile --      Pulse Rate 03/21/24 1342 64     Resp 03/21/24 1342 18     Temp 03/21/24 1342 (!) 97.2 F (36.2 C)     Temp Source 03/21/24 1342 Axillary     SpO2 03/21/24 1342 96 %     Weight 03/21/24 1343 152 lb (68.9 kg)     Height 03/21/24 1343 5' 8 (1.727 m)     Head Circumference --      Peak Flow --      Pain Score --      Pain Loc --      Pain Education --      Exclude from Growth Chart --     Most recent vital signs: Vitals:   03/21/24 1342  BP: (!) 166/102  Pulse: 64  Resp: 18  Temp: (!) 97.2 F (36.2 C)  SpO2: 96%      General: Awake, no distress.  CV:  Good peripheral perfusion.  Resp:  Normal effort.  Abd:  No distention.  Other:  EOMI.  PERRLA.  No nystagmus.  No photophobia.  No facial droop.  Normal speech.  Motor and sensory intact in all extremities.  No pronator drift.  No ataxia on finger-nose or heel-to-shin.   ED Results / Procedures / Treatments   Labs (all labs ordered are listed, but only abnormal results are displayed) Labs Reviewed  COMPREHENSIVE METABOLIC PANEL WITH GFR - Abnormal; Notable for the following components:      Result Value   Alkaline Phosphatase 146 (*)    All other components within normal limits  CBC     EKG    RADIOLOGY    PROCEDURES:  Critical Care performed: No  Procedures   MEDICATIONS ORDERED IN ED: Medications - No data to display   IMPRESSION / MDM / ASSESSMENT AND PLAN / ED COURSE  I reviewed the triage vital  signs and the nursing notes.  83 year old male with PMH as noted above presents due to MRI today showing an acute small left cerebellar stroke.  The patient is asymptomatic.  Differential diagnosis includes, but is not limited to, ischemic CVA.  We will obtain lab workup and consult neurology for further recommendations.  Patient's presentation is most consistent with acute presentation with potential threat to life or bodily function.  The patient is on the cardiac monitor to evaluate for evidence of arrhythmia and/or significant heart rate changes  ----------------------------------------- 2:38 PM on 03/21/2024 -----------------------------------------  CMP and CBC show no acute findings.  I consulted and discussed the case with Dr. Matthews from neurology who recommended inpatient admission for stroke workup.  However, the patient states that he wants to be discharged.  I had an extensive discussion with him and his wife about the MRI findings, neurology recommendations, the additional test that are normally done during a  stroke workup, and the risk of worsening stroke symptoms or a new stroke that would cause noticeable symptoms, up to and including permanent disability and death.  The patient was able to paraphrase these risks back to me.  He demonstrates appropriate understanding of the risks.  He has full decision-making capacity.  After discussing it with his wife, he would still like to go home.  He is being discharged AGAINST MEDICAL ADVICE.  I advised him that he may return at any time if he changes his mind and wishes to resume his workup in the hospital.  I recommended that if he does not return here, he must follow-up with his PMD as soon as possible.  Dr. Matthews recommended starting him on dual antiplatelet therapy.  I prescribed a 3-week course of Plavix.  The patient is in agreement with taking it.  I gave him strict return precautions and he expressed understanding.   FINAL CLINICAL IMPRESSION(S) / ED DIAGNOSES   Final diagnoses:  Cerebellar stroke (HCC)     Rx / DC Orders   ED Discharge Orders          Ordered    clopidogrel (PLAVIX) 75 MG tablet  Daily        03/21/24 1438             Note:  This document was prepared using Dragon voice recognition software and may include unintentional dictation errors.    Jacolyn Pae, MD 03/21/24 [phone]

## 2024-03-21 NOTE — ED Triage Notes (Signed)
 First nurse note: pt denies sx. States was told to get an MRI on monday

## 2024-03-21 NOTE — ED Triage Notes (Signed)
 Pt had an MRI today that showed acute left cerebellar infarct 7mm. PCP referred Pt to get MRI but Pt states he does not know why. Pt denies SOB, CP, dizziness. NIHSS 0. Pt reports chronic migraines that have been improving with maxalt. GCS 15

## 2024-03-21 NOTE — Discharge Instructions (Addendum)
 As we discussed, your MRI showing that you had a stroke in the cerebellum which is a part of your brain that controls movement and coordination.  Make an appointment to follow-up with Dr. Valora as soon as possible.  You may need further workup as an outpatient.  Start taking the Plavix, and continue taking your aspirin and other medications.  You may return to the hospital at any time if you change your mind and want to resume your workup here.  You should also return immediately if you have any new or worsening symptoms including any weakness or numbness, problems with balance or coordination, difficulty speaking, changes in your vision, or any other new or worsening symptoms that concern you.

## 2024-03-24 ENCOUNTER — Observation Stay

## 2024-03-24 ENCOUNTER — Observation Stay
Admission: EM | Admit: 2024-03-24 | Discharge: 2024-03-25 | Disposition: A | Discharge status: Home / Self Care | Attending: Hospitalist | Admitting: Hospitalist

## 2024-03-24 ENCOUNTER — Other Ambulatory Visit: Payer: Self-pay

## 2024-03-24 DIAGNOSIS — Z79899 Other long term (current) drug therapy: Secondary | ICD-10-CM | POA: Diagnosis not present

## 2024-03-24 DIAGNOSIS — I639 Cerebral infarction, unspecified: Secondary | ICD-10-CM | POA: Diagnosis not present

## 2024-03-24 DIAGNOSIS — H409 Unspecified glaucoma: Secondary | ICD-10-CM | POA: Insufficient documentation

## 2024-03-24 DIAGNOSIS — I63233 Cerebral infarction due to unspecified occlusion or stenosis of bilateral carotid arteries: Secondary | ICD-10-CM | POA: Diagnosis not present

## 2024-03-24 DIAGNOSIS — I10 Essential (primary) hypertension: Principal | ICD-10-CM | POA: Diagnosis present

## 2024-03-24 DIAGNOSIS — E785 Hyperlipidemia, unspecified: Secondary | ICD-10-CM | POA: Diagnosis not present

## 2024-03-24 DIAGNOSIS — E039 Hypothyroidism, unspecified: Secondary | ICD-10-CM | POA: Diagnosis not present

## 2024-03-24 DIAGNOSIS — I63542 Cerebral infarction due to unspecified occlusion or stenosis of left cerebellar artery: Secondary | ICD-10-CM | POA: Diagnosis not present

## 2024-03-24 DIAGNOSIS — K589 Irritable bowel syndrome without diarrhea: Secondary | ICD-10-CM | POA: Insufficient documentation

## 2024-03-24 DIAGNOSIS — R4182 Altered mental status, unspecified: Secondary | ICD-10-CM | POA: Diagnosis not present

## 2024-03-24 DIAGNOSIS — I081 Rheumatic disorders of both mitral and tricuspid valves: Secondary | ICD-10-CM | POA: Diagnosis not present

## 2024-03-24 DIAGNOSIS — G930 Cerebral cysts: Secondary | ICD-10-CM

## 2024-03-24 DIAGNOSIS — Z0389 Encounter for observation for other suspected diseases and conditions ruled out: Secondary | ICD-10-CM | POA: Diagnosis not present

## 2024-03-24 DIAGNOSIS — Z7902 Long term (current) use of antithrombotics/antiplatelets: Secondary | ICD-10-CM | POA: Diagnosis not present

## 2024-03-24 DIAGNOSIS — Z79890 Hormone replacement therapy (postmenopausal): Secondary | ICD-10-CM | POA: Diagnosis not present

## 2024-03-24 LAB — LIPID PANEL
Cholesterol: 151 mg/dL (ref 0–200)
HDL: 49 mg/dL (ref >40)
LDL Cholesterol: 79 mg/dL (ref 0–99)
Total CHOL/HDL Ratio: 3.1 ratio
Triglycerides: 113 mg/dL (ref <150)
VLDL: 23 mg/dL (ref 0–40)

## 2024-03-24 LAB — CBC
HCT: 46.3 % (ref 39.0–52.0)
Hemoglobin: 15.7 g/dL (ref 13.0–17.0)
MCH: 31.5 pg (ref 26.0–34.0)
MCHC: 33.9 g/dL (ref 30.0–36.0)
MCV: 93 fL (ref 80.0–100.0)
Platelets: 240 K/uL (ref 150–400)
RBC: 4.98 MIL/uL (ref 4.22–5.81)
RDW: 12.9 % (ref 11.5–15.5)
WBC: 6.4 K/uL (ref 4.0–10.5)
nRBC: 0 % (ref 0.0–0.2)

## 2024-03-24 LAB — APTT: aPTT: 30 s (ref 24–36)

## 2024-03-24 LAB — URINE DRUG SCREEN, QUALITATIVE (ARMC ONLY)
Amphetamines, Ur Screen: NOT DETECTED
Barbiturates, Ur Screen: NOT DETECTED
Benzodiazepine, Ur Scrn: NOT DETECTED
Cannabinoid 50 Ng, Ur ~~LOC~~: NOT DETECTED
Cocaine Metabolite,Ur ~~LOC~~: NOT DETECTED
MDMA (Ecstasy)Ur Screen: NOT DETECTED
Methadone Scn, Ur: NOT DETECTED
Opiate, Ur Screen: NOT DETECTED
Phencyclidine (PCP) Ur S: NOT DETECTED
Tricyclic, Ur Screen: NOT DETECTED

## 2024-03-24 LAB — COMPREHENSIVE METABOLIC PANEL WITH GFR
ALT: 15 U/L (ref 0–44)
AST: 23 U/L (ref 15–41)
Albumin: 3.2 g/dL — ABNORMAL LOW (ref 3.5–5.0)
Alkaline Phosphatase: 152 U/L — ABNORMAL HIGH (ref 38–126)
Anion gap: 11 (ref 5–15)
BUN: 17 mg/dL (ref 8–23)
CO2: 24 mmol/L (ref 22–32)
Calcium: 8.9 mg/dL (ref 8.9–10.3)
Chloride: 105 mmol/L (ref 98–111)
Creatinine, Ser: 1.03 mg/dL (ref 0.61–1.24)
GFR, Estimated: >60 mL/min (ref >60)
Glucose, Bld: 103 mg/dL — ABNORMAL HIGH (ref 70–99)
Potassium: 4.2 mmol/L (ref 3.5–5.1)
Sodium: 140 mmol/L (ref 135–145)
Total Bilirubin: 0.9 mg/dL (ref 0.0–1.2)
Total Protein: 6.9 g/dL (ref 6.5–8.1)

## 2024-03-24 LAB — DIFFERENTIAL
Abs Immature Granulocytes: 0.03 K/uL (ref 0.00–0.07)
Basophils Absolute: 0.1 K/uL (ref 0.0–0.1)
Basophils Relative: 1 %
Eosinophils Absolute: 0.2 K/uL (ref 0.0–0.5)
Eosinophils Relative: 3 %
Immature Granulocytes: 1 %
Lymphocytes Relative: 27 %
Lymphs Abs: 1.7 K/uL (ref 0.7–4.0)
Monocytes Absolute: 0.7 K/uL (ref 0.1–1.0)
Monocytes Relative: 12 %
Neutro Abs: 3.6 K/uL (ref 1.7–7.7)
Neutrophils Relative %: 56 %

## 2024-03-24 LAB — HEMOGLOBIN A1C
Hgb A1c MFr Bld: 5 % (ref 4.8–5.6)
Mean Plasma Glucose: 96.8 mg/dL

## 2024-03-24 LAB — PROTIME-INR
INR: 1 (ref 0.8–1.2)
Prothrombin Time: 13.9 s (ref 11.4–15.2)

## 2024-03-24 LAB — ETHANOL: Alcohol, Ethyl (B): <15 mg/dL (ref <15)

## 2024-03-24 MED ORDER — ACETAMINOPHEN 160 MG/5ML PO SOLN: 650.0000 mg | ORAL | Status: DC | PRN

## 2024-03-24 MED ORDER — ACETAMINOPHEN 325 MG PO TABS: 650.0000 mg | ORAL_TABLET | ORAL | Status: DC | PRN

## 2024-03-24 MED ORDER — ACETAMINOPHEN 650 MG RE SUPP: 650.0000 mg | RECTAL | Status: DC | PRN

## 2024-03-24 MED ORDER — STROKE: EARLY STAGES OF RECOVERY BOOK: Freq: Once | Status: AC

## 2024-03-24 MED ORDER — SENNOSIDES-DOCUSATE SODIUM 8.6-50 MG PO TABS: 1.0000 | ORAL_TABLET | Freq: Every evening | ORAL | Status: DC | PRN

## 2024-03-24 MED ORDER — IOHEXOL 350 MG/ML SOLN
75.0000 mL | Freq: Once | INTRAVENOUS | Status: AC | PRN
  Administered 2024-03-24: 75 mL via INTRAVENOUS

## 2024-03-24 MED ORDER — SODIUM CHLORIDE 0.9% FLUSH
3.0000 mL | Freq: Once | INTRAVENOUS | Status: AC
  Administered 2024-03-24: 3 mL via INTRAVENOUS

## 2024-03-24 MED ORDER — SODIUM CHLORIDE 0.9 % IV SOLN: INTRAVENOUS | Status: DC

## 2024-03-24 MED ORDER — ENOXAPARIN SODIUM 40 MG/0.4ML IJ SOSY
40.0000 mg | PREFILLED_SYRINGE | INTRAMUSCULAR | Status: DC
  Administered 2024-03-24: 40 mg via SUBCUTANEOUS
  Filled 2024-03-24: qty 0.4

## 2024-03-24 NOTE — Consult Note (Signed)
 Reason for Consult:Cerebellar infarct Requesting Physician: Fernand  CC: Abnormal MRI  I have been asked by Dr. Fernand to see this patient in consultation for cerebellar infarct.  HPI: Adrian Mitchell is an 83 y.o. male with a history of HLD and heart murmur who presents after having had an abnormal MRI.  Patient was obtaining imaging for f/u of an arachnoid cyst.  Acute cerebellar infarct noted for imaging and patient referred for admission.  Patient is asymptomatic.  Past Medical History:  Diagnosis Date   ED (erectile dysfunction)    Glaucoma    Heart murmur    History of shingles    HLD (hyperlipidemia)    Migraine    none for over 3 months   Postherpetic neuralgia    Wears dentures    full upper and lower    Past Surgical History:  Procedure Laterality Date   CATARACT EXTRACTION W/PHACO Right 07/23/2019   Procedure: CATARACT EXTRACTION PHACO AND INTRAOCULAR LENS PLACEMENT (IOC) RIGHT 6.92 00:51.1 13.5%;  Surgeon: Mittie Gaskin, MD;  Location: [facility];  Service: Ophthalmology;  Laterality: Right;   COLONOSCOPY  2013   CYSTOSCOPY  2016   HERNIA REPAIR     27yrs ago    Family History  Problem Relation Age of Onset   Prostate cancer Father    Heart disease Mother     Social History:  reports that he has never smoked. He has never used smokeless tobacco. He reports that he does not drink alcohol and does not use drugs.  Allergies  Allergen Reactions   Topiramate Other (See Comments)    Orthostatic BP/Low blood pressure  Orthostatic BP/Low blood pressure     Advicor [Niacin-Lovastatin Er]    Niacin    Penicillins Rash    Medications: Prior to Admission:  Medications Prior to Admission  Medication Sig Dispense Refill Last Dose/Taking   Cyanocobalamin  (VITAMIN B-12 PO) Take by mouth daily.   03/24/2024   dorzolamide-timolol  (COSOPT) 22.3-6.8 MG/ML ophthalmic solution Place 1 drop into both eyes 2 (two) times daily.   03/24/2024   finasteride   (PROSCAR ) 5 MG tablet Take 1 tablet (5 mg total) by mouth daily. 90 tablet 3 03/23/2024   latanoprost (XALATAN) 0.005 % ophthalmic solution 1 drop nightly   03/23/2024 Evening   levothyroxine (SYNTHROID) 75 MCG tablet Take 75 mcg by mouth daily before breakfast.   03/24/2024   LINZESS 72 MCG capsule Take 72 mcg by mouth every morning.   03/24/2024   predniSONE (DELTASONE) 10 MG tablet Take 10 mg by mouth daily.   Taking   rizatriptan (MAXALT) 5 MG tablet Take 5 mg by mouth as needed for migraine.   Taking As Needed   simvastatin (ZOCOR) 20 MG tablet Take 20 mg by mouth daily.   03/23/2024 Bedtime   amLODipine (NORVASC) 2.5 MG tablet  (Patient not taking: No sig reported)   Not Taking   aspirin 81 MG tablet Take 81 mg by mouth 2 (two) times daily.  (Patient not taking: Reported on 03/24/2024)   Not Taking   aspirin-sod bicarb-citric acid (ALKA-SELTZER) 325 MG TBEF tablet Take 325 mg by mouth every 6 (six) hours as needed. (Patient not taking: Reported on 03/24/2024)   Not Taking   brimonidine  (ALPHAGAN ) 0.2 % ophthalmic solution SMARTSIG:In Eye(s) (Patient not taking: Reported on 03/24/2024)   Not Taking   butalbital-acetaminophen-caffeine (FIORICET, ESGIC) 50-325-40 MG per tablet Take 1 tablet by mouth 2 (two) times daily as needed for headache.  clopidogrel (PLAVIX) 75 MG tablet Take 1 tablet (75 mg total) by mouth daily. 30 tablet 0    doxycycline  (MONODOX ) 100 MG capsule Take 1 capsule (100 mg total) by mouth daily with supper. (Patient not taking: No sig reported) 30 capsule 2    gabapentin (NEURONTIN) 300 MG capsule Take 300 mg by mouth at bedtime. (Patient not taking: Reported on 03/24/2024)   Not Taking   Ivermectin  (SOOLANTRA ) 1 % CREA Apply to face QHS (Patient not taking: No sig reported) 45 g 2    meclizine  (ANTIVERT ) 12.5 MG tablet Take 1 tablet (12.5 mg total) by mouth 2 (two) times daily as needed for dizziness. (Patient not taking: No sig reported) 20 tablet 0    pantoprazole  (PROTONIX) 20 MG tablet Take by mouth. (Patient not taking: No sig reported)   Not Taking   prednisoLONE acetate (PRED FORTE) 1 % ophthalmic suspension SMARTSIG:In Eye(s) (Patient not taking: Reported on 03/24/2024)   Not Taking    ROS: History obtained from the patient  General ROS: negative for - chills, fatigue, fever, night sweats, weight gain or weight loss Psychological ROS: negative for - behavioral disorder, hallucinations, memory difficulties, mood swings or suicidal ideation Ophthalmic ROS: negative for - blurry vision, double vision, eye pain or loss of vision ENT ROS: negative for - epistaxis, nasal discharge, oral lesions, sore throat, tinnitus or vertigo Allergy and Immunology ROS: negative for - hives or itchy/watery eyes Hematological and Lymphatic ROS: negative for - bleeding problems, bruising or swollen lymph nodes Endocrine ROS: negative for - galactorrhea, hair pattern changes, polydipsia/polyuria or temperature intolerance Respiratory ROS: negative for - cough, hemoptysis, shortness of breath or wheezing Cardiovascular ROS: negative for - chest pain, dyspnea on exertion, edema or irregular heartbeat Gastrointestinal ROS: negative for - abdominal pain, diarrhea, hematemesis, nausea/vomiting or stool incontinence Genito-Urinary ROS: negative for - dysuria, hematuria, incontinence or urinary frequency/urgency Musculoskeletal ROS: negative for - joint swelling or muscular weakness Neurological ROS: as noted in HPI Dermatological ROS: negative for rash and skin lesion changes   Physical Examination: Blood pressure (!) 155/74, pulse 64, temperature 97.6 F (36.4 C), resp. rate 14, height 5' 8 (1.727 m), weight 68.9 kg, SpO2 98%. HEENT-  Normocephalic, no lesions, without obvious abnormality.  Normal external eye and conjunctiva.  Normal TM's bilaterally.  Normal auditory canals and external ears. Normal external nose, mucus membranes and septum.  Normal  pharynx. Cardiovascular- Single S1, S2, pulses palpable throughout   Lungs- CTA Abdomen- soft, non-tender; bowel sounds normal; no masses,  no organomegaly Extremities- no edema Musculoskeletal-no joint tenderness, deformity or swelling Skin-warm and dry, no hyperpigmentation, vitiligo, or suspicious lesions  Neurological Examination   Mental Status: Alert, oriented, thought content appropriate.  Speech fluent without evidence of aphasia.  Able to follow 3 step commands without difficulty. Cranial Nerves: II: Visual fields grossly normal III,IV, VI: ptosis not present, extra-ocular motions intact bilaterally V,VII: smile symmetric, facial light touch sensation normal bilaterally VIII: hearing normal bilaterally IX,X: gag reflex present XI: bilateral shoulder shrug XII: midline tongue extension Motor: Right : Upper extremity   5/5    Left:     Upper extremity   5/5  Lower extremity   5/5     Lower extremity   5/5 Tone and bulk:normal tone throughout; no atrophy noted Sensory: Pinprick and light touch intact throughout, bilaterally Deep Tendon Reflexes: 2+ and symmetric throughout Plantars: Right: downgoing   Left: upgoing Cerebellar: normal finger-to-nose, rapid alternating movements somewhat slowed on the left  but patient is right handed and normal heel-to-shin test Gait: not tested      Laboratory Studies:   Basic Metabolic Panel: Recent Labs  Lab 03/21/24 1346 03/24/24 1506  NA 139 140  K 4.0 4.2  CL 107 105  CO2 24 24  GLUCOSE 97 103*  BUN 17 17  CREATININE 0.98 1.03  CALCIUM 8.9 8.9    Liver Function Tests: Recent Labs  Lab 03/21/24 1346 03/24/24 1506  AST 23 23  ALT 14 15  ALKPHOS 146* 152*  BILITOT 0.8 0.9  PROT 6.6 6.9  ALBUMIN 3.6 3.2*   No results for input(s): LIPASE, AMYLASE in the last 168 hours. No results for input(s): AMMONIA in the last 168 hours.  CBC: Recent Labs  Lab 03/21/24 1346 03/24/24 1506  WBC 7.4 6.4  NEUTROABS  --   3.6  HGB 15.6 15.7  HCT 46.2 46.3  MCV 93.5 93.0  PLT 256 240    Cardiac Enzymes: No results for input(s): CKTOTAL, CKMB, CKMBINDEX, TROPONINI in the last 168 hours.  BNP: Invalid input(s): POCBNP  CBG: No results for input(s): GLUCAP in the last 168 hours.  Microbiology: Results for orders placed or performed in visit on 08/03/21  Microscopic Examination     Status: None   Collection Time: 08/03/21 10:14 AM   Urine  Result Value Ref Range Status   WBC, UA None seen 0 - 5 /hpf Final   RBC, Urine 0-2 0 - 2 /hpf Final   Epithelial Cells (non renal) None seen 0 - 10 /hpf Final   Bacteria, UA None seen None seen/Few Final    Coagulation Studies: Recent Labs    03/24/24 1506  LABPROT 13.9  INR 1.0    Urinalysis: No results for input(s): COLORURINE, LABSPEC, PHURINE, GLUCOSEU, HGBUR, BILIRUBINUR, KETONESUR, PROTEINUR, UROBILINOGEN, NITRITE, LEUKOCYTESUR in the last 168 hours.  Invalid input(s): APPERANCEUR  Lipid Panel:     Component Value Date/Time   CHOL 151 03/24/2024 1506   TRIG 113 03/24/2024 1506   HDL 49 03/24/2024 1506   CHOLHDL 3.1 03/24/2024 1506   VLDL 23 03/24/2024 1506   LDLCALC 79 03/24/2024 1506    HgbA1C: No results found for: HGBA1C  Urine Drug Screen:  No results found for: LABOPIA, COCAINSCRNUR, LABBENZ, AMPHETMU, THCU, LABBARB  Alcohol Level:  Recent Labs  Lab 03/24/24 1506  ETH <15    Imaging: MRI BRAIN WITHOUT CONTRAST 03/21/2024 11:16:33 AM   TECHNIQUE: Multiplanar multisequence MRI of the head/brain was performed without the administration of intravenous contrast.   COMPARISON: MRI head 03/12/2020.   CLINICAL HISTORY: Intracranial arachnoid cyst. Patient complains of migraines. Spoke to radiologist - recommended patient go to ED due to suspected stroke. Patient instructed to go to ER per radiologist recommendation; however, patient refused to go to ED. Left with family  member.   FINDINGS:   BRAIN AND VENTRICLES: There is a 7 mm focus of restricted diffusion in the posterolateral aspect of the left cerebellum. There is no significant associated edema or mass effect on the current study. Generalized parenchymal volume loss. Scattered T2 FLAIR hyperintensity in the periventricular and subcortical white matter compatible with mild chronic microvascular ischemic changes. Midline retrocerebellar fluid with intact cerebellar vermis. There is mild associated mass effect in the cerebellum suggestive of an arachnoid cyst associated with chronic remodeling of the adjacent occipital calvarium. No intracranial hemorrhage. No midline shift. No hydrocephalus. The sella is unremarkable. Normal flow voids.   ORBITS: No acute abnormality.   SINUSES AND  MASTOIDS: No acute abnormality.   BONES AND SOFT TISSUES: Normal marrow signal. Chronic remodeling of the adjacent occipital calvarium associated with the arachnoid cyst. Bilateral sphenoid wings replacement. No acute soft tissue abnormality.   IMPRESSION: 1. Acute left cerebellar infarct measuring 7 mm, without significant edema or mass effect. 2. Mild chronic microvascular ischemic changes and parenchymal volume loss.   Assessment/Plan:  83 y.o. male with a history of HLD and heart murmur who presents after having had an abnormal MRI.  Patient was obtaining imaging for f/u of an arachnoid cyst.  Acute cerebellar infarct noted for imaging and patient referred for admission.  Patient is asymptomatic.   MRI personally reviewed and reveals a small acute left cerebellar infarct.  Concern is for an embolic etiology.   LDL 79, A1c pending.  Carotid doppler results pending  Recommendations BP goal <140/80 Would change lipid lowering agent to a high intensity statin.  LDL goal <70 A1c goal <7.0 Would obtain CTA of the head and neck to evaluate posterior circulation Echocardiogram is pending Continue Plavix 75mg   daily.  Recently instituted Telemetry.  If unremarkable while hospitalized would consider long term monitoring on an outpatient basis.    Sonny Hock, MD Neurology  03/24/2024, 6:44 PM

## 2024-03-24 NOTE — ED Triage Notes (Signed)
 Pt to ED via POV. Pt states he had an MRI done on Friday that showed a stroke and MD wanted to admit him for further testing. Pt did not want to be admitted at that time. Pt states he was called by [facility] and told to check in at the ER for an admission. Pt alert and oriented, ambulatory to triage, NAD.

## 2024-03-24 NOTE — Plan of Care (Signed)
  Problem: Ischemic Stroke/TIA Tissue Perfusion: Goal: Complications of ischemic stroke/TIA will be minimized Outcome: Progressing   Problem: Education: Goal: Knowledge of disease or condition will improve Outcome: Progressing Goal: Knowledge of secondary prevention will improve (MUST DOCUMENT ALL) Outcome: Progressing Goal: Knowledge of patient specific risk factors will improve (DELETE if not current risk factor) Outcome: Progressing   Problem: Coping: Goal: Will verbalize positive feelings about self Outcome: Progressing Goal: Will identify appropriate support needs Outcome: Progressing   Problem: Self-Care: Goal: Ability to participate in self-care as condition permits will improve Outcome: Progressing Goal: Verbalization of feelings and concerns over difficulty with self-care will improve Outcome: Progressing Goal: Ability to communicate needs accurately will improve Outcome: Progressing   Problem: Nutrition: Goal: Risk of aspiration will decrease Outcome: Progressing Goal: Dietary intake will improve Outcome: Progressing   Problem: Education: Goal: Knowledge of General Education information will improve Description: Including pain rating scale, medication(s)/side effects and non-pharmacologic comfort measures Outcome: Progressing   Problem: Health Behavior/Discharge Planning: Goal: Ability to manage health-related needs will improve Outcome: Progressing   Problem: Clinical Measurements: Goal: Ability to maintain clinical measurements within normal limits will improve Outcome: Progressing Goal: Will remain free from infection Outcome: Progressing Goal: Diagnostic test results will improve Outcome: Progressing Goal: Respiratory complications will improve Outcome: Progressing Goal: Cardiovascular complication will be avoided Outcome: Progressing   Problem: Activity: Goal: Risk for activity intolerance will decrease Outcome: Progressing   Problem:  Nutrition: Goal: Adequate nutrition will be maintained Outcome: Progressing   Problem: Coping: Goal: Level of anxiety will decrease Outcome: Progressing   Problem: Elimination: Goal: Will not experience complications related to bowel motility Outcome: Progressing Goal: Will not experience complications related to urinary retention Outcome: Progressing   Problem: Pain Managment: Goal: General experience of comfort will improve and/or be controlled Outcome: Progressing   Problem: Safety: Goal: Ability to remain free from injury will improve Outcome: Progressing   Problem: Skin Integrity: Goal: Risk for impaired skin integrity will decrease Outcome: Progressing

## 2024-03-24 NOTE — H&P (Signed)
 History and Physical    Adrian Mitchell FMW:[id number] DOB: [birth year] DOA: 03/24/2024  DOS: the patient was seen and examined on 03/24/2024  PCP: Valora Lynwood FALCON, MD   Patient coming from: Home  I have personally briefly reviewed patient's old medical records in [facility] and CareEverywhere  HPI:   Adrian Mitchell is a 83 y.o. year old male with past medical history of hypertension, hyperlipidemia, hypothyroidism, migraines and history of CVA presenting to the ED after being referred by PCP for acute cerebellar infarct on 10/24.   Pt states he went for routine MRI to check on a arachnoid cyst was found to have an acute cerebellar stroke.     On arrival to the ED patient was noted to be HDS stable.  Patient presented initially on 10-24 at the request of his PCP left AMA.  He presents again today after receiving multiple calls to come to the hospital.  EDP consulted [facility] for admission for further stroke workup.   Review of Systems: As mentioned in the history of present illness. All other systems reviewed and are negative.   Past Medical History:  Diagnosis Date   ED (erectile dysfunction)    Glaucoma    Heart murmur    History of shingles    HLD (hyperlipidemia)    Migraine    none for over 3 months   Postherpetic neuralgia    Wears dentures    full upper and lower    Past Surgical History:  Procedure Laterality Date   CATARACT EXTRACTION W/PHACO Right 07/23/2019   Procedure: CATARACT EXTRACTION PHACO AND INTRAOCULAR LENS PLACEMENT (IOC) RIGHT 6.92 00:51.1 13.5%;  Surgeon: Mittie Gaskin, MD;  Location: [facility];  Service: Ophthalmology;  Laterality: Right;   COLONOSCOPY  2013   CYSTOSCOPY  2016   HERNIA REPAIR     20yrs ago     Allergies  Allergen Reactions   Topiramate Other (See Comments)    Orthostatic BP/Low blood pressure  Orthostatic BP/Low blood pressure     Advicor [Niacin-Lovastatin Er]    Niacin    Penicillins Rash     Family History  Problem Relation Age of Onset   Prostate cancer Father    Heart disease Mother     Prior to Admission medications   Medication Sig Start Date End Date Taking? Authorizing Provider  Cyanocobalamin  (VITAMIN B-12 PO) Take by mouth daily.   Yes [provider]  dorzolamide-timolol  (COSOPT) 22.3-6.8 MG/ML ophthalmic solution Place 1 drop into both eyes 2 (two) times daily.   Yes [provider]  finasteride  (PROSCAR ) 5 MG tablet Take 1 tablet (5 mg total) by mouth daily. 02/27/24  Yes Vaillancourt, Samantha, PA-C  latanoprost (XALATAN) 0.005 % ophthalmic solution 1 drop nightly 12/16/14  Yes [provider]  levothyroxine (SYNTHROID) 75 MCG tablet Take 75 mcg by mouth daily before breakfast. 03/17/24 03/17/25 Yes [provider]  LINZESS 72 MCG capsule Take 72 mcg by mouth every morning.   Yes [provider]  predniSONE (DELTASONE) 10 MG tablet Take 10 mg by mouth daily. 11/19/23  Yes [provider]  rizatriptan (MAXALT) 5 MG tablet Take 5 mg by mouth as needed for migraine. 07/04/21  Yes [provider]  simvastatin (ZOCOR) 20 MG tablet Take 20 mg by mouth daily.   Yes [provider]  amLODipine (NORVASC) 2.5 MG tablet  07/17/18   [provider]  aspirin 81 MG tablet Take 81 mg by mouth 2 (two) times  daily.  Patient not taking: Reported on 03/24/2024    [provider]  aspirin-sod bicarb-citric acid (ALKA-SELTZER) 325 MG TBEF tablet Take 325 mg by mouth every 6 (six) hours as needed. Patient not taking: Reported on 03/24/2024    [provider]  brimonidine  (ALPHAGAN ) 0.2 % ophthalmic solution SMARTSIG:In Eye(s) Patient not taking: Reported on 03/24/2024 05/17/21   [provider]  butalbital-acetaminophen-caffeine (FIORICET, ESGIC) 50-325-40 MG per tablet Take 1 tablet by mouth 2 (two) times daily as needed for headache.    [provider]  clopidogrel  (PLAVIX) 75 MG tablet Take 1 tablet (75 mg total) by mouth daily. 03/21/24 04/20/24  Siadecki, Sebastian, MD  doxycycline  (MONODOX ) 100 MG capsule Take 1 capsule (100 mg total) by mouth daily with supper. Patient not taking: No sig reported 12/06/22   Jackquline Sawyer, MD  gabapentin (NEURONTIN) 300 MG capsule Take 300 mg by mouth at bedtime. Patient not taking: Reported on 03/24/2024 01/22/24   [provider]  Ivermectin  (SOOLANTRA ) 1 % CREA Apply to face QHS Patient not taking: No sig reported 12/12/22   Jackquline Sawyer, MD  meclizine  (ANTIVERT ) 12.5 MG tablet Take 1 tablet (12.5 mg total) by mouth 2 (two) times daily as needed for dizziness. Patient not taking: No sig reported 10/11/19   Patt Alm Macho, MD  pantoprazole (PROTONIX) 20 MG tablet Take by mouth. Patient not taking: No sig reported 02/24/22 02/24/23  [provider]  prednisoLONE acetate (PRED FORTE) 1 % ophthalmic suspension SMARTSIG:In Eye(s) Patient not taking: Reported on 03/24/2024 06/30/21   [provider]    Social History:  reports that he has never smoked. He has never used smokeless tobacco. He reports that he does not drink alcohol and does not use drugs. Lives with wife Tobacco- Denies use. EtOH- Denies use.  Illicit drug use- denies use.  IADLs/ADLs- can perform independently at baseline    Physical Exam: Vitals:   03/24/24 1349 03/24/24 1352 03/24/24 1505 03/24/24 1627  BP: (!) 130/90  (!) 157/83 (!) 155/74  Pulse: 100  66 64  Resp: 16  18 14   Temp: (!) 97.5 F (36.4 C)   97.6 F (36.4 C)  TempSrc: Oral     SpO2: 98%  98% 98%  Weight:  68.9 kg    Height:  5' 8 (1.727 m)     Gen: No acute distress HENT: NCAT CV: RRR, good pulses in all extremities Resp: CTAB Abd: No TTP, normal bowel sounds MSK: No asymmetry Neuro: Alert and oriented x 4, CN II-XII intact, 5 out of 5 strength and normal sensation in upper and lower extremities, normal finger-nose testing, no  dysdiadochokinesia Psych: Pleasant mood   Labs on Admission: I have personally reviewed following labs and imaging studies  CBC: Recent Labs  Lab 03/21/24 1346 03/24/24 1506  WBC 7.4 6.4  NEUTROABS  --  3.6  HGB 15.6 15.7  HCT 46.2 46.3  MCV 93.5 93.0  PLT 256 240   Basic Metabolic Panel: Recent Labs  Lab 03/21/24 1346 03/24/24 1506  NA 139 140  K 4.0 4.2  CL 107 105  CO2 24 24  GLUCOSE 97 103*  BUN 17 17  CREATININE 0.98 1.03  CALCIUM 8.9 8.9   GFR: Estimated Creatinine Clearance: 51.7 mL/min (by C-G formula based on SCr of 1.03 mg/dL). Liver Function Tests: Recent Labs  Lab 03/21/24 1346 03/24/24 1506  AST 23 23  ALT 14 15  ALKPHOS 146* 152*  BILITOT 0.8 0.9  PROT  6.6 6.9  ALBUMIN 3.6 3.2*   No results for input(s): LIPASE, AMYLASE in the last 168 hours. No results for input(s): AMMONIA in the last 168 hours. Coagulation Profile: Recent Labs  Lab 03/24/24 1506  INR 1.0   Cardiac Enzymes: No results for input(s): CKTOTAL, CKMB, CKMBINDEX, TROPONINI, TROPONINIHS in the last 168 hours. BNP (last 3 results) No results for input(s): BNP in the last 8760 hours. HbA1C: No results for input(s): HGBA1C in the last 72 hours. CBG: No results for input(s): GLUCAP in the last 168 hours. Lipid Profile: Recent Labs    03/24/24 1506  CHOL 151  HDL 49  LDLCALC 79  TRIG 113  CHOLHDL 3.1   Thyroid  Function Tests: No results for input(s): TSH, T4TOTAL, FREET4, T3FREE, THYROIDAB in the last 72 hours. Anemia Panel: No results for input(s): VITAMINB12, FOLATE, FERRITIN, TIBC, IRON, RETICCTPCT in the last 72 hours. Urine analysis:    Component Value Date/Time   COLORURINE YELLOW (A) 10/11/2019 2217   APPEARANCEUR Clear 08/03/2021 1014   LABSPEC 1.011 10/11/2019 2217   PHURINE 6.0 10/11/2019 2217   GLUCOSEU Negative 08/03/2021 1014   HGBUR NEGATIVE 10/11/2019 2217   BILIRUBINUR Negative 08/03/2021 1014    KETONESUR 20 (A) 10/11/2019 2217   PROTEINUR Negative 08/03/2021 1014   PROTEINUR NEGATIVE 10/11/2019 2217   NITRITE Negative 08/03/2021 1014   NITRITE NEGATIVE 10/11/2019 2217   LEUKOCYTESUR Negative 08/03/2021 1014   LEUKOCYTESUR NEGATIVE 10/11/2019 2217    Radiological Exams on Admission: I have personally reviewed images No results found.  EKG: My personal interpretation of EKG shows: Sinus rhythm without any acute ST changes.    Assessment/Plan Principal Problem:   Cerebellar infarct (HCC) Active Problems:   HLD (hyperlipidemia)   Essential hypertension   Hypothyroidism   Cerebellar Infarction Pt with symptoms of weakness found to have a stroke 2 days ago. Pt outside the window of tPA. Neurology consulted, appreciate their recommendations.  -Will allow for permissive hypertension but if significantly elevated can restart his home medicines as his stroke was 2 days ago. - Will start DAPT, pt on plavix but not on ASA. Will start ASA 81 mg daily after confirming with neurology, messaged neurology but no response as of yet.  - High Intensity Statin - Echocardiogram  - Carotid doppler  - A1C  - Lipid panel  - Tele monitoring  - PT/OT, bedside swallow and if abnormal SLP consult.  Chronic Problems: Hypertension: Not on any pharmacotherapy.  Will hold antihypertensives currently in start if BP significantly elevated has a stroke was 2 days ago. Hyperlipidemia: Getting lipid panel, will changes simvastatin to Lipitor 40. IBS: Continue home Linzess Hypothyroidism: Continue home Synthroid Glaucoma: Continue home eyedrops   VTE prophylaxis:  Lovenox  Diet: N.p.o. Code Status:  Full Code Telemetry:  Admission status: Observation, Telemetry bed Patient is from: Home Anticipated d/c is to: Home Anticipated d/c is in: 1-2 days   Family Communication: Updated at bedside  Consults called: None   Severity of Illness: The appropriate patient status for this patient is  OBSERVATION. Observation status is judged to be reasonable and necessary in order to provide the required intensity of service to ensure the patient's safety. The patient's presenting symptoms, physical exam findings, and initial radiographic and laboratory data in the context of their medical condition is felt to place them at decreased risk for further clinical deterioration. Furthermore, it is anticipated that the patient will be medically stable for discharge from the hospital within 2 midnights of admission.  Morene Bathe, MD Jolynn DEL. [facility]

## 2024-03-24 NOTE — ED Notes (Signed)
 Called CCMD for central monitoring at this time

## 2024-03-24 NOTE — ED Provider Notes (Signed)
 [facility] Provider Note    Event Date/Time   First MD Initiated Contact with Patient 03/24/24 1438     (approximate)   History   Abnormal MRI  and +Stroke   HPI  Adrian Mitchell is a 83 y.o. male with history of migraines, presenting with abnormal MRI.  Patient states that he has no focal weakness or numbness, no vision changes or double vision, no difficulty with ambulation.  States that he does get intermittent upper abdominal pain but no pain right now.  No nausea vomiting or diarrhea, no urinary symptoms or chest pain, no shortness of breath.  States that he had an MRI done to evaluate an arachnoid cyst.  On independent chart review, patient had MRI on 24 October that showed a left acute cerebellar infarct measuring 7 mm.     Physical Exam   Triage Vital Signs: ED Triage Vitals  Encounter Vitals Group     BP 03/24/24 1349 (!) 130/90     Girls Systolic BP Percentile --      Girls Diastolic BP Percentile --      Boys Systolic BP Percentile --      Boys Diastolic BP Percentile --      Pulse Rate 03/24/24 1349 100     Resp 03/24/24 1349 16     Temp 03/24/24 1349 (!) 97.5 F (36.4 C)     Temp Source 03/24/24 1349 Oral     SpO2 03/24/24 1349 98 %     Weight 03/24/24 1352 152 lb (68.9 kg)     Height 03/24/24 1352 5' 8 (1.727 m)     Head Circumference --      Peak Flow --      Pain Score 03/24/24 1350 5     Pain Loc --      Pain Education --      Exclude from Growth Chart --     Most recent vital signs: Vitals:   03/24/24 1349 03/24/24 1505  BP: (!) 130/90 (!) 157/83  Pulse: 100 66  Resp: 16 18  Temp: (!) 97.5 F (36.4 C)   SpO2: 98% 98%     General: Awake, no distress.  CV:  Good peripheral perfusion.  Resp:  Normal effort.  Abd:  No distention.  Soft nontender Other:  Pupils are equal reactive, extraocular movements are intact, no clear nerve deficits, no focal weakness or numbness, no dysmetria, steady gait with  ambulation.   ED Results / Procedures / Treatments   Labs (all labs ordered are listed, but only abnormal results are displayed) Labs Reviewed  CBC  DIFFERENTIAL  PROTIME-INR  APTT  COMPREHENSIVE METABOLIC PANEL WITH GFR  ETHANOL  URINE DRUG SCREEN, QUALITATIVE ([facility] ONLY)  CBG MONITORING, ED      PROCEDURES:  Critical Care performed: No  Procedures   MEDICATIONS ORDERED IN ED: Medications  sodium chloride  flush (NS) 0.9 % injection 3 mL (has no administration in time range)     IMPRESSION / MDM / ASSESSMENT AND PLAN / ED COURSE  I reviewed the triage vital signs and the nursing notes.                              Differential diagnosis includes, but is not limited to, CVA, appears to be asymptomatic at this time.  Will plan to admit for stroke workup and management.  Labs ordered.  Patient's presentation is most consistent  with acute presentation with potential threat to life or bodily function.  Consulted hospitalist who will admit the patient.  He is admitted.       FINAL CLINICAL IMPRESSION(S) / ED DIAGNOSES   Final diagnoses:  Cerebrovascular accident (CVA), unspecified mechanism (HCC)     Rx / DC Orders   ED Discharge Orders     None        Note:  This document was prepared using Dragon voice recognition software and may include unintentional dictation errors.    Waymond Lorelle Cummins, MD 03/24/24 [phone]

## 2024-03-25 ENCOUNTER — Ambulatory Visit

## 2024-03-25 ENCOUNTER — Observation Stay: Admit: 2024-03-25 | Discharge: 2024-03-25 | Disposition: A | Attending: Internal Medicine

## 2024-03-25 DIAGNOSIS — G930 Cerebral cysts: Secondary | ICD-10-CM | POA: Diagnosis not present

## 2024-03-25 DIAGNOSIS — I639 Cerebral infarction, unspecified: Secondary | ICD-10-CM | POA: Diagnosis not present

## 2024-03-25 DIAGNOSIS — I6389 Other cerebral infarction: Secondary | ICD-10-CM | POA: Diagnosis not present

## 2024-03-25 DIAGNOSIS — E785 Hyperlipidemia, unspecified: Secondary | ICD-10-CM | POA: Diagnosis not present

## 2024-03-25 DIAGNOSIS — I63542 Cerebral infarction due to unspecified occlusion or stenosis of left cerebellar artery: Secondary | ICD-10-CM | POA: Diagnosis not present

## 2024-03-25 LAB — ECHOCARDIOGRAM COMPLETE BUBBLE STUDY
AR max vel: 3.18 cm2
AV Area VTI: 3 cm2
AV Area mean vel: 3.03 cm2
AV Mean grad: 2 mmHg
AV Peak grad: 2.9 mmHg
Ao pk vel: 0.86 m/s
Area-P 1/2: 2.79 cm2
MV VTI: 2.03 cm2
S' Lateral: 2.7 cm

## 2024-03-25 MED ORDER — CLOPIDOGREL BISULFATE 75 MG PO TABS
75.0000 mg | ORAL_TABLET | Freq: Every day | ORAL | Status: DC
  Administered 2024-03-25: 75 mg via ORAL
  Filled 2024-03-25: qty 1

## 2024-03-25 MED ORDER — LEVOTHYROXINE SODIUM 50 MCG PO TABS
75.0000 ug | ORAL_TABLET | Freq: Every day | ORAL | Status: DC
  Administered 2024-03-25: 75 ug via ORAL
  Filled 2024-03-25: qty 1

## 2024-03-25 MED ORDER — ATORVASTATIN CALCIUM 40 MG PO TABS: 40.0000 mg | ORAL_TABLET | Freq: Every day | ORAL | 2 refills | Status: AC

## 2024-03-25 MED ORDER — ATORVASTATIN CALCIUM 20 MG PO TABS
40.0000 mg | ORAL_TABLET | Freq: Every day | ORAL | Status: DC
  Administered 2024-03-25: 40 mg via ORAL
  Filled 2024-03-25: qty 2

## 2024-03-25 MED ORDER — FINASTERIDE 5 MG PO TABS
5.0000 mg | ORAL_TABLET | Freq: Every day | ORAL | Status: DC
  Administered 2024-03-25: 5 mg via ORAL
  Filled 2024-03-25: qty 1

## 2024-03-25 MED ORDER — LINACLOTIDE 72 MCG PO CAPS
72.0000 ug | ORAL_CAPSULE | Freq: Every morning | ORAL | Status: DC
  Administered 2024-03-25: 72 ug via ORAL
  Filled 2024-03-25: qty 1

## 2024-03-25 NOTE — Evaluation (Signed)
 Occupational Therapy Evaluation Patient Details Name: [name] MRN: [id number] DOB: Apr 02, 1941 Today's Date: 03/25/2024   History of Present Illness   Adrian Mitchell is an 83 y.o. male with a history of HLD and heart murmur who presents after having had an abnormal MRI.  Patient was obtaining imaging for f/u of an arachnoid cyst.  Acute cerebellar infarct noted on imaging and patient referred for admission.    Clinical Impressions Adrian Mitchell was seen for OT evaluation this date. Prior to hospital admission, pt was IND. Pt lives with spouse. Pt currently requires SUPERVISION for ADL t/f ~200 ft, intermittent furniture walking noted. IND dressing tasks seated. No ataxia noted with UE assessment, bilateral UE 5/5. Pt appears near functional baseline, no skilled acute OT needs identified, will sign off. No OT follow up needs recommended.     If plan is discharge home, recommend the following:   Help with stairs or ramp for entrance     Functional Status Assessment   Patient has had a recent decline in their functional status and demonstrates the ability to make significant improvements in function in a reasonable and predictable amount of time.     Equipment Recommendations   None recommended by OT     Recommendations for Other Services         Precautions/Restrictions   Precautions Precautions: Fall Recall of Precautions/Restrictions: Intact Restrictions Weight Bearing Restrictions Per Provider Order: No     Mobility Bed Mobility Overal bed mobility: Independent                  Transfers Overall transfer level: Independent                        Balance Overall balance assessment: Needs assistance Sitting-balance support: No upper extremity supported, Feet supported Sitting balance-Leahy Scale: Normal     Standing balance support: No upper extremity supported, During functional activity Standing balance-Leahy Scale: Good Standing  balance comment: mild balance deficits reaching outside BOS and single leg stance                           ADL either performed or assessed with clinical judgement   ADL Overall ADL's : Needs assistance/impaired                                       General ADL Comments: SUPERVISION for ADL t/f ~200 ft, intermittent furniture walking noted. IND dressing tasks seated      Pertinent Vitals/Pain Pain Assessment Pain Assessment: No/denies pain     Extremity/Trunk Assessment Upper Extremity Assessment Upper Extremity Assessment: Overall WFL for tasks assessed   Lower Extremity Assessment Lower Extremity Assessment: Overall WFL for tasks assessed       Communication Communication Communication: No apparent difficulties   Cognition Arousal: Alert Behavior During Therapy: WFL for tasks assessed/performed Cognition: No apparent impairments                               Following commands: Intact                  Home Living Family/patient expects to be discharged to:: Private residence Living Arrangements: Spouse/significant other Available Help at Discharge: Family;Available 24 hours/day Type of Home: House Home Access: Stairs to enter  Entrance Stairs-Number of Steps: 1   Home Layout: Two level;Able to live on main level with bedroom/bathroom                          Prior Functioning/Environment Prior Level of Function : Independent/Modified Independent;Driving                    OT Problem List: Impaired balance (sitting and/or standing)        OT Goals(Current goals can be found in the care plan section)   Acute Rehab OT Goals Patient Stated Goal: to go home OT Goal Formulation: With patient Time For Goal Achievement: 03/25/24 Potential to Achieve Goals: Good   AM-PAC OT 6 Clicks Daily Activity     Outcome Measure Help from another person eating meals?: None Help from another person  taking care of personal grooming?: None Help from another person toileting, which includes using toliet, bedpan, or urinal?: None Help from another person bathing (including washing, rinsing, drying)?: None Help from another person to put on and taking off regular upper body clothing?: None Help from another person to put on and taking off regular lower body clothing?: None 6 Click Score: 24   End of Session Nurse Communication: Mobility status  Activity Tolerance: Patient tolerated treatment well Patient left: in bed;with call bell/phone within reach;with bed alarm set;with family/visitor present  OT Visit Diagnosis: Unsteadiness on feet (R26.81)                Time: 9099-9083 OT Time Calculation (min): 16 min Charges:  OT General Charges $OT Visit: 1 Visit OT Evaluation $OT Eval Low Complexity: 1 Low  Adrian Mitchell, M.S. OTR/L  03/25/24, 10:10 AM  ascom [phone]

## 2024-03-25 NOTE — Discharge Summary (Signed)
 Physician Discharge Summary   Adrian Mitchell  male DOB: [birth year]  FMW:[id number]  PCP: Valora Lynwood FALCON, MD  Admit date: 03/24/2024 Discharge date: 03/25/2024  Admitted From: home Disposition:  home Wife updated at bedside prior to discharge. CODE STATUS: Full code  Discharge Instructions     Diet - low sodium heart healthy   Complete by: As directed       Hospital Course:  For full details, please see H&P, progress notes, consult notes and ancillary notes.  Briefly,  Adrian Mitchell is a 83 y.o. year old male with past medical history of hypertension, hyperlipidemia, hypothyroidism, migraines and history of CVA presenting to the ED after being referred by PCP for acute cerebellar infarct on 10/24.   Pt states he went for routine MRI to check on an arachnoid cyst was found to have an acute cerebellar stroke.     Cerebellar Infarction Pt found to have a stroke 2 days ago.  Largely asymptomatic.  Pt outside the window of tPA.  Neurology consulted. --CTA head/neck Negative for large vessel occlusion.  Carotid US  wnl.  Echo no evidence  of any interatrial shunt.   --per neuro, cont home plavix alone. (Not taking ASA PTA) --upgraded statin from home Zocor 20 mg to Lipitor 40 mg daily. --discharged on cardiac monitor, will f/u with [name].   Hypertension:  Not on any antihypertensive.  Not taking amlodipine PTA.    Constipation Continue home Linzess  Hypothyroidism:  Continue home Synthroid  Glaucoma:  Continue home eyedrops   Unless noted above, medications under STOP list are ones pt was not taking PTA.  Discharge Diagnoses:  Principal Problem:   Cerebellar infarct [name]) Active Problems:   HLD (hyperlipidemia)   Essential hypertension   Hypothyroidism     Discharge Instructions:  Allergies as of 03/25/2024       Reactions   Topiramate Other (See Comments)   Orthostatic BP/Low blood pressure  Orthostatic BP/Low blood pressure    Advicor  [niacin-lovastatin Er]    Niacin    Penicillins Rash        Medication List     STOP taking these medications    amLODipine 2.5 MG tablet Commonly known as: NORVASC   aspirin 81 MG tablet   aspirin-sod bicarb-citric acid 325 MG Tbef tablet Commonly known as: ALKA-SELTZER   brimonidine  0.2 % ophthalmic solution Commonly known as: ALPHAGAN    doxycycline  100 MG capsule Commonly known as: MONODOX    gabapentin 300 MG capsule Commonly known as: NEURONTIN   Ivermectin  1 % Crea Commonly known as: Soolantra    meclizine  12.5 MG tablet Commonly known as: ANTIVERT    pantoprazole 20 MG tablet Commonly known as: PROTONIX   prednisoLONE acetate 1 % ophthalmic suspension Commonly known as: PRED FORTE   predniSONE 10 MG tablet Commonly known as: DELTASONE   simvastatin 20 MG tablet Commonly known as: ZOCOR       TAKE these medications    atorvastatin 40 MG tablet Commonly known as: LIPITOR Take 1 tablet (40 mg total) by mouth daily. Start taking on: March 26, 2024   butalbital-acetaminophen-caffeine 50-325-40 MG tablet Commonly known as: FIORICET Take 1 tablet by mouth 2 (two) times daily as needed for headache.   clopidogrel 75 MG tablet Commonly known as: Plavix Take 1 tablet (75 mg total) by mouth daily.   dorzolamide-timolol  2-0.5 % ophthalmic solution Commonly known as: COSOPT Place 1 drop into both eyes 2 (two) times daily.   finasteride  5 MG tablet  Commonly known as: PROSCAR  Take 1 tablet (5 mg total) by mouth daily.   latanoprost 0.005 % ophthalmic solution Commonly known as: XALATAN 1 drop nightly   levothyroxine 75 MCG tablet Commonly known as: SYNTHROID Take 75 mcg by mouth daily before breakfast.   Linzess 72 MCG capsule Generic drug: linaclotide Take 72 mcg by mouth every morning.   rizatriptan 5 MG tablet Commonly known as: MAXALT Take 5 mg by mouth as needed for migraine.   VITAMIN B-12 PO Take by mouth daily.          Follow-up Information     Valora Lynwood FALCON, MD Follow up.   Specialty: Family Medicine Why: hospital follow up Contact information: [address] [name] [phone]                 Allergies  Allergen Reactions   Topiramate Other (See Comments)    Orthostatic BP/Low blood pressure  Orthostatic BP/Low blood pressure     Advicor [Niacin-Lovastatin Er]    Niacin    Penicillins Rash     The results of significant diagnostics from this hospitalization (including imaging, microbiology, ancillary and laboratory) are listed below for reference.   Consultations:   Procedures/Studies: US  Carotid Bilateral (at [facility] and [facility] only) Result Date: 03/25/2024 CLINICAL DATA:  Stroke EXAM: BILATERAL CAROTID DUPLEX ULTRASOUND TECHNIQUE: Elnor scale imaging, color Doppler and duplex ultrasound were performed of bilateral carotid and vertebral arteries in the neck. COMPARISON:  CT angiogram of the head and neck performed March 24, 2024 FINDINGS: Criteria: Quantification of carotid stenosis is based on velocity parameters that correlate the residual internal carotid diameter with NASCET-based stenosis levels, using the diameter of the distal internal carotid lumen as the denominator for stenosis measurement. The following velocity measurements were obtained: RIGHT ICA: 88 cm/sec CCA: 64 cm/sec SYSTOLIC ICA/CCA RATIO:  1.4 ECA: 81 cm/sec LEFT ICA: 65 cm/sec CCA: 54 cm/sec SYSTOLIC ICA/CCA RATIO:  1.2 ECA: 105 cm/sec RIGHT CAROTID ARTERY: Patent. RIGHT VERTEBRAL ARTERY:  Antegrade. LEFT CAROTID ARTERY:  Patent. LEFT VERTEBRAL ARTERY:  Antegrade. IMPRESSION: 1. Right carotid system: Within normal limits. No evidence of flow-limiting stenosis. 2. Left carotid system: Within normal limits. No evidence of flow-limiting stenosis. Electronically Signed   By: Maude Naegeli M.D.   On: 03/25/2024 07:33   CT ANGIO HEAD NECK W WO CM Addendum Date: 03/25/2024 ADDENDUM #1 EXAM: CT HEAD WITHOUT AND  CTA HEAD AND NECK WITH AND WITHOUT 03/24/2024 08:49:53 PM TECHNIQUE: CTA of the head and neck was performed with and without the administration of 75 mL of iohexol  (OMNIPAQUE ) 350 MG/ML injection. Noncontrast CT of the head with reconstructed 2-D images are also provided for review. Multiplanar 2D and/or 3D reformatted images are provided for review. Automated exposure control, iterative reconstruction, and/or weight based adjustment of the mA/kV was utilized to reduce the radiation dose to as low as reasonably achievable. COMPARISON: Prior MRI from 03/21/2024. CLINICAL HISTORY: FINDINGS: CT HEAD: BRAIN AND VENTRICLES: Age related cerebral atrophy with mild chronic microvascular ischemic disease. Previously identified subcentimeter left cerebellar infarct not well visualized by CT. No other acute large vessel territory infarct. No acute intracranial hemorrhage. 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 No mass effect. No midline shift. No extra-axial fluid collection. No hydrocephalus. ORBITS: No acute abnormality. SINUSES AND MASTOIDS: No acute abnormality. CTA NECK: AORTIC ARCH AND ARCH VESSELS: No dissection or arterial injury. No significant stenosis of the brachiocephalic or subclavian arteries. CERVICAL CAROTID ARTERIES: Eccentric soft plaque at  the left carotid bulb without hemodynamically significant greater than 50% stenosis. No dissection, arterial injury, or hemodynamically significant stenosis by NASCET criteria. CERVICAL VERTEBRAL ARTERIES: Right vertebral artery dominant and widely patent in the vertebral basilar junction. Diminutive left vertebral artery patent as well. No dissection, arterial injury, or significant stenosis. LUNGS AND MEDIASTINUM: Unremarkable. SOFT TISSUES: No acute abnormality. BONES: Calcified atherosclerosis noted about the skull base. No acute abnormality. CTA HEAD: ANTERIOR CIRCULATION: Mild atherosclerotic change about the carotid siphons without hemodynamically  significant stenosis. Right A1 dominant. Left A1 hypoplastic. No significant stenosis of the middle cerebral arteries. No aneurysm. POSTERIOR CIRCULATION: Both PICA patent. Basilar patent without stenosis. Superior cerebellar arteries patent bilaterally. Right PCA supplied via the basilar. Fetal type origin of the left PCA. No significant stenosis of the posterior cerebral arteries. No significant stenosis of the basilar artery. No significant stenosis of the vertebral arteries. No aneurysm. OTHER: No dural venous sinus thrombosis on this non-dedicated study. IMPRESSION: 1. Negative CTA for large vessel occlusion or other emergent finding. 2. Mild for age atheromatous disease about the left carotid bulb and carotid siphons without hemodynamically significant stenosis stenosis. 3. No other new acute intracranial abnormality. Previously identified left cerebellar infarct not well seen by CT. Electronically signed by: Morene Hoard MD 03/25/2024 02:05 AM EDT RP Workstation: [id number]   Result Date: 03/25/2024 ORIGINAL REPORT  EXAM: CT HEAD WITHOUT CTA HEAD AND NECK WITH AND WITHOUT 03/24/2024 08:49:53 PM TECHNIQUE: CTA of the head and neck was performed with and without the administration of intravenous contrast. Noncontrast CT of the head with reconstructed 2-D images are also provided for review. Multiplanar 2D and/or 3D reformatted images are provided for review. Automated exposure control, iterative reconstruction, and/or weight based adjustment of the mA/kV was utilized to reduce the radiation dose to as low as reasonably achievable. COMPARISON: None available CLINICAL HISTORY: Neuro deficit, acute, stroke suspected. Table formatting from the original note was not included. FINDINGS: BRAIN AND VENTRICLES: No acute intracranial hemorrhage. No mass effect. No midline shift. No extra-axial fluid collection. No evidence of acute infarct. No hydrocephalus. ORBITS: No acute abnormality. SINUSES AND MASTOIDS:  No acute abnormality. AORTIC ARCH AND ARCH VESSELS: No dissection or arterial injury. No significant stenosis of the brachiocephalic or subclavian arteries. CERVICAL CAROTID ARTERIES: No dissection, arterial injury, or hemodynamically significant stenosis by NASCET criteria. CERVICAL VERTEBRAL ARTERIES: No dissection, arterial injury, or significant stenosis. LUNGS AND MEDIASTINUM: Unremarkable. SOFT TISSUES: No acute abnormality. BONES: No acute abnormality. ANTERIOR CIRCULATION: No significant stenosis of the internal carotid arteries. No significant stenosis of the anterior cerebral arteries. No significant stenosis of the middle cerebral arteries. No aneurysm. POSTERIOR CIRCULATION: No significant stenosis of the posterior cerebral arteries. No significant stenosis of the basilar artery. No significant stenosis of the vertebral arteries. No aneurysm. OTHER: No dural venous sinus thrombosis on this non-dedicated study. IMPRESSION: 1. No large vessel occlusion in the head or neck. Electronically signed by: Morene Hoard MD 03/25/2024 01:46 AM EDT RP Workstation: [id number]   MR BRAIN WO CONTRAST Result Date: 03/21/2024 EXAM: MRI BRAIN WITHOUT CONTRAST 03/21/2024 11:16:33 AM TECHNIQUE: Multiplanar multisequence MRI of the head/brain was performed without the administration of intravenous contrast. COMPARISON: MRI head 03/12/2020. CLINICAL HISTORY: Intracranial arachnoid cyst. Patient complains of migraines. Spoke to radiologist - recommended patient go to ED due to suspected stroke. Patient instructed to go to ER per radiologist recommendation; however, patient refused to go to ED. Left with family member. FINDINGS: BRAIN AND VENTRICLES: There is a 7 mm focus  of restricted diffusion in the posterolateral aspect of the left cerebellum. There is no significant associated edema or mass effect on the current study. Generalized parenchymal volume loss. Scattered T2 FLAIR hyperintensity in the periventricular  and subcortical white matter compatible with mild chronic microvascular ischemic changes. Midline retrocerebellar fluid with intact cerebellar vermis. There is mild associated mass effect in the cerebellum suggestive of an arachnoid cyst associated with chronic remodeling of the adjacent occipital calvarium. No intracranial hemorrhage. No midline shift. No hydrocephalus. The sella is unremarkable. Normal flow voids. ORBITS: No acute abnormality. SINUSES AND MASTOIDS: No acute abnormality. BONES AND SOFT TISSUES: Normal marrow signal. Chronic remodeling of the adjacent occipital calvarium associated with the arachnoid cyst. Bilateral sphenoid wings replacement. No acute soft tissue abnormality. IMPRESSION: 1. Acute left cerebellar infarct measuring 7 mm, without significant edema or mass effect. 2. Mild chronic microvascular ischemic changes and parenchymal volume loss. Electronically signed by: Donnice Mania MD 03/21/2024 11:35 AM EDT RP Workstation: [id number]      Labs: BNP (last 3 results) No results for input(s): BNP in the last 8760 hours. Basic Metabolic Panel: Recent Labs  Lab 03/21/24 1346 03/24/24 1506  NA 139 140  K 4.0 4.2  CL 107 105  CO2 24 24  GLUCOSE 97 103*  BUN 17 17  CREATININE 0.98 1.03  CALCIUM 8.9 8.9   Liver Function Tests: Recent Labs  Lab 03/21/24 1346 03/24/24 1506  AST 23 23  ALT 14 15  ALKPHOS 146* 152*  BILITOT 0.8 0.9  PROT 6.6 6.9  ALBUMIN 3.6 3.2*   No results for input(s): LIPASE, AMYLASE in the last 168 hours. No results for input(s): AMMONIA in the last 168 hours. CBC: Recent Labs  Lab 03/21/24 1346 03/24/24 1506  WBC 7.4 6.4  NEUTROABS  --  3.6  HGB 15.6 15.7  HCT 46.2 46.3  MCV 93.5 93.0  PLT 256 240   Cardiac Enzymes: No results for input(s): CKTOTAL, CKMB, CKMBINDEX, TROPONINI in the last 168 hours. BNP: Invalid input(s): POCBNP CBG: No results for input(s): GLUCAP in the last 168 hours. D-Dimer No results  for input(s): DDIMER in the last 72 hours. Hgb A1c Recent Labs    03/24/24 1506  HGBA1C 5.0   Lipid Profile Recent Labs    03/24/24 1506  CHOL 151  HDL 49  LDLCALC 79  TRIG 113  CHOLHDL 3.1   Thyroid  function studies No results for input(s): TSH, T4TOTAL, T3FREE, THYROIDAB in the last 72 hours.  Invalid input(s): FREET3 Anemia work up No results for input(s): VITAMINB12, FOLATE, FERRITIN, TIBC, IRON, RETICCTPCT in the last 72 hours. Urinalysis    Component Value Date/Time   COLORURINE YELLOW (A) 10/11/2019 2217   APPEARANCEUR Clear 08/03/2021 1014   LABSPEC 1.011 10/11/2019 2217   PHURINE 6.0 10/11/2019 2217   GLUCOSEU Negative 08/03/2021 1014   HGBUR NEGATIVE 10/11/2019 2217   BILIRUBINUR Negative 08/03/2021 1014   KETONESUR 20 (A) 10/11/2019 2217   PROTEINUR Negative 08/03/2021 1014   PROTEINUR NEGATIVE 10/11/2019 2217   NITRITE Negative 08/03/2021 1014   NITRITE NEGATIVE 10/11/2019 2217   LEUKOCYTESUR Negative 08/03/2021 1014   LEUKOCYTESUR NEGATIVE 10/11/2019 2217   Sepsis Labs Recent Labs  Lab 03/21/24 1346 03/24/24 1506  WBC 7.4 6.4   Microbiology No results found for this or any previous visit (from the past 240 hours).   Total time spend on discharging this patient, including the last patient exam, discussing the hospital stay, instructions for ongoing care as it relates to  all pertinent caregivers, as well as preparing the medical discharge records, prescriptions, and/or referrals as applicable, is 35 minutes.    Ellouise Haber, MD  [facility] 03/25/2024, 1:20 PM

## 2024-03-25 NOTE — Progress Notes (Signed)
 SLP Cancellation Note  Patient Details Name: Adrian Mitchell MRN: [id number] DOB: [birth year]   Cancelled treatment:       Reason Eval/Treat Not Completed: SLP screened, no needs identified, will sign off (chart reviewed; consulted NSG and met w/ pt and Wife in room.)  is an 83 y.o. male with a history of HLD and heart murmur who presents after having had an abnormal MRI. Patient was obtaining imaging for f/u of an arachnoid cyst. Acute cerebellar infarct noted on imaging and patient referred for admission. Pt lives w/ Wife.   Pt denied any difficulty swallowing and is currently on a regular diet; tolerates swallowing pills w/ water per NSG. Pt conversed in Full conversation w/out expressive/receptive deficits noted; pt denied any speech-language deficits. Speech clear. He was oriented to name/year/situation of admit/where he lived. He requested to go to the bathroom; NT contacted.  No further skilled ST services indicated as pt appears at his baseline. Pt agreed. NSG to reconsult if any change in status while admitted.       Comer Portugal, MS, CCC-SLP Speech Language Pathologist Rehab Services; [facility] [phone] (ascom) Keylie Beavers 03/25/2024, 12:16 PM

## 2024-03-25 NOTE — Progress Notes (Signed)
*  PRELIMINARY RESULTS* Echocardiogram 2D Echocardiogram has been performed.  Floydene Harder 03/25/2024, 8:10 AM

## 2024-03-25 NOTE — Evaluation (Signed)
 Physical Therapy Evaluation Patient Details Name: DEMARRIUS GUERRERO MRN: [id number] DOB: 08-14-1940 Today's Date: 03/25/2024  History of Present Illness  Torrell B Floyd is an 83 y.o. male with a history of HLD and heart murmur who presents after having had an abnormal MRI.  Patient was obtaining imaging for f/u of an arachnoid cyst.  Acute cerebellar infarct noted on imaging and patient referred for admission.  Clinical Impression  Patient resting in bed upon arrival to session; alert and oriented, follows commands and agreeable to participation with session.  Reports he feels symptoms have largely resolved, at/near baseline for most functional activities. Bilat UE/LE strength and ROM grossly symmetrical and WFL; no focal weakness, sensory or gross coordination deficits appreciated.  Denies pain. Able to complete bed mobility with indep; sit/stand with indep; basic transfers and gait (200') without assist device, mod indep.  Demonstrates reciprocal stepping with good step height/length; good trunk rotation and arm swing. Completes head turns, changes of direction, start/stop and speed modulation with minimal sway/gait deviation.  Mild higher-level balance deficits noted with BERG assessment (narrowed/tandem BOS, SLS); overall BERG score 46/56. Would benefit from skilled PT to address above deficits and promote optimal return to PLOF.; recommend post-acute PT follow up as indicated by interdisciplinary care team.            If plan is discharge home, recommend the following: A little help with walking and/or transfers   Can travel by private vehicle        Equipment Recommendations    Recommendations for Other Services       Functional Status Assessment Patient has had a recent decline in their functional status and demonstrates the ability to make significant improvements in function in a reasonable and predictable amount of time.     Precautions / Restrictions Precautions Precautions:  Fall Recall of Precautions/Restrictions: Intact Restrictions Weight Bearing Restrictions Per Provider Order: No      Mobility  Bed Mobility Overal bed mobility: Independent                  Transfers Overall transfer level: Independent Equipment used: None                    Ambulation/Gait Ambulation/Gait assistance: Supervision, Modified independent (Device/Increase time) Gait Distance (Feet): 200 Feet Assistive device: None         General Gait Details: reciprocal stepping with good step height/length; good trunk rotation and arm swing.  Completes head turns, changes of direction, start/stop and speed modulation with minimal sway/gait deviation.  Stairs            Wheelchair Mobility     Tilt Bed    Modified Rankin (Stroke Patients Only)       Balance Overall balance assessment: Needs assistance Sitting-balance support: No upper extremity supported, Feet supported Sitting balance-Leahy Scale: Normal     Standing balance support: No upper extremity supported Standing balance-Leahy Scale: Good                   Standardized Balance Assessment Standardized Balance Assessment : Berg Balance Test Berg Balance Test Sit to Stand: Able to stand without using hands and stabilize independently Standing Unsupported: Able to stand safely 2 minutes Sitting with Back Unsupported but Feet Supported on Floor or Stool: Able to sit safely and securely 2 minutes Stand to Sit: Sits safely with minimal use of hands Transfers: Able to transfer safely, minor use of hands Standing Unsupported with Eyes Closed: Able  to stand 10 seconds safely Standing Ubsupported with Feet Together: Able to place feet together independently and stand 1 minute safely From Standing, Reach Forward with Outstretched Arm: Can reach forward >12 cm safely (5) From Standing Position, Pick up Object from Floor: Able to pick up shoe, needs supervision From Standing Position,  Turn to Look Behind Over each Shoulder: Looks behind from both sides and weight shifts well Turn 360 Degrees: Able to turn 360 degrees safely in 4 seconds or less Standing Unsupported, Alternately Place Feet on Step/Stool: Able to complete >2 steps/needs minimal assist Standing Unsupported, One Foot in Front: Able to take small step independently and hold 30 seconds Standing on One Leg: Tries to lift leg/unable to hold 3 seconds but remains standing independently Total Score: 46         Pertinent Vitals/Pain Pain Assessment Pain Assessment: No/denies pain    Home Living Family/patient expects to be discharged to:: Private residence Living Arrangements: Spouse/significant other Available Help at Discharge: Family;Available 24 hours/day Type of Home: House Home Access: Stairs to enter   [employer] of Steps: 1   Home Layout: Two level;Able to live on main level with bedroom/bathroom        Prior Function Prior Level of Function : Independent/Modified Independent;Driving                     Extremity/Trunk Assessment   Upper Extremity Assessment Upper Extremity Assessment: Overall WFL for tasks assessed    Lower Extremity Assessment Lower Extremity Assessment: Overall WFL for tasks assessed (grossly at least 4+/5 throughout; no focal weakness, sensory or gross coordination deficit appreciated)       Communication   Communication Communication: No apparent difficulties    Cognition Arousal: Alert Behavior During Therapy: WFL for tasks assessed/performed   PT - Cognitive impairments: No apparent impairments                         Following commands: Intact       Cueing       General Comments      Exercises     Assessment/Plan    PT Assessment Patient needs continued PT services  PT Problem List Decreased activity tolerance;Decreased balance;Decreased mobility;Decreased knowledge of use of DME;Decreased safety  awareness;Decreased knowledge of precautions       PT Treatment Interventions DME instruction;Gait training;Stair training;Functional mobility training;Therapeutic activities;Therapeutic exercise;Balance training;Neuromuscular re-education;Patient/family education    PT Goals (Current goals can be found in the Care Plan section)  Acute Rehab PT Goals Patient Stated Goal: to go back home PT Goal Formulation: With patient/family Time For Goal Achievement: 04/08/24 Potential to Achieve Goals: Good    Frequency Min 3X/week     Co-evaluation               AM-PAC PT 6 Clicks Mobility  Outcome Measure Help needed turning from your back to your side while in a flat bed without using bedrails?: None Help needed moving from lying on your back to sitting on the side of a flat bed without using bedrails?: None Help needed moving to and from a bed to a chair (including a wheelchair)?: None Help needed standing up from a chair using your arms (e.g., wheelchair or bedside chair)?: None Help needed to walk in hospital room?: None Help needed climbing 3-5 steps with a railing? : A Little 6 Click Score: 23    End of Session   Activity Tolerance: Patient tolerated  treatment well Patient left: in bed;with call bell/phone within reach;with bed alarm set;with family/visitor present Nurse Communication: Mobility status PT Visit Diagnosis: Hemiplegia and hemiparesis    Time: 9099-9083 PT Time Calculation (min) (ACUTE ONLY): 16 min   Charges:   PT Evaluation $PT Eval Low Complexity: 1 Low   PT General Charges $$ ACUTE PT VISIT: 1 Visit         Hasaan Radde H. Delores, PT, DPT, NCS 03/25/24, 12:00 PM [phone]

## 2024-03-25 NOTE — Plan of Care (Signed)
  Problem: Education: Goal: Knowledge of disease or condition will improve Outcome: Progressing Goal: Knowledge of secondary prevention will improve (MUST DOCUMENT ALL) Outcome: Progressing Goal: Knowledge of patient specific risk factors will improve (DELETE if not current risk factor) Outcome: Progressing   Problem: Ischemic Stroke/TIA Tissue Perfusion: Goal: Complications of ischemic stroke/TIA will be minimized Outcome: Progressing   Problem: Coping: Goal: Will verbalize positive feelings about self Outcome: Progressing Goal: Will identify appropriate support needs Outcome: Progressing   Problem: Health Behavior/Discharge Planning: Goal: Ability to manage health-related needs will improve Outcome: Progressing Goal: Goals will be collaboratively established with patient/family Outcome: Progressing   Problem: Self-Care: Goal: Ability to participate in self-care as condition permits will improve Outcome: Progressing Goal: Verbalization of feelings and concerns over difficulty with self-care will improve Outcome: Progressing Goal: Ability to communicate needs accurately will improve Outcome: Progressing   Problem: Nutrition: Goal: Risk of aspiration will decrease Outcome: Progressing Goal: Dietary intake will improve Outcome: Progressing   Problem: Education: Goal: Knowledge of General Education information will improve Description: Including pain rating scale, medication(s)/side effects and non-pharmacologic comfort measures Outcome: Progressing   Problem: Health Behavior/Discharge Planning: Goal: Ability to manage health-related needs will improve Outcome: Progressing   Problem: Clinical Measurements: Goal: Ability to maintain clinical measurements within normal limits will improve Outcome: Progressing Goal: Will remain free from infection Outcome: Progressing Goal: Diagnostic test results will improve Outcome: Progressing Goal: Respiratory complications will  improve Outcome: Progressing Goal: Cardiovascular complication will be avoided Outcome: Progressing   Problem: Activity: Goal: Risk for activity intolerance will decrease Outcome: Progressing   Problem: Nutrition: Goal: Adequate nutrition will be maintained Outcome: Progressing   Problem: Coping: Goal: Level of anxiety will decrease Outcome: Progressing   Problem: Elimination: Goal: Will not experience complications related to bowel motility Outcome: Progressing Goal: Will not experience complications related to urinary retention Outcome: Progressing   Problem: Pain Managment: Goal: General experience of comfort will improve and/or be controlled Outcome: Progressing   Problem: Safety: Goal: Ability to remain free from injury will improve Outcome: Progressing

## 2024-03-25 NOTE — Progress Notes (Signed)
 Subjective: Patient reports continues to be at baseline.  No new events.  Objective: Current vital signs: BP 139/69 (BP Location: Left Arm)   Pulse 61   Temp 98.1 F (36.7 C) (Oral)   Resp 16   Ht 5' 8 (1.727 m)   Wt 68.9 kg   SpO2 95%   BMI 23.11 kg/m  Vital signs in last 24 hours: Temp:  [97.5 F (36.4 C)-98.2 F (36.8 C)] 98.1 F (36.7 C) (10/28 0724) Pulse Rate:  [57-100] 61 (10/28 0724) Resp:  [14-20] 16 (10/28 0724) BP: (120-157)/(60-90) 139/69 (10/28 0724) SpO2:  [95 %-98 %] 95 % (10/28 0724) Weight:  [68.9 kg] 68.9 kg (10/27 1352)  Intake/Output from previous day: 10/27 0701 - 10/28 0700 In: 433.9 [I.V.:433.9] Out: -  Intake/Output this shift: No intake/output data recorded. Nutritional status:  Diet Order             Diet Heart Room service appropriate? Yes; Fluid consistency: Thin  Diet effective now                   Neurologic Exam: Mental Status: Alert, oriented, thought content appropriate.  Speech fluent without evidence of aphasia.  Able to follow 3 step commands without difficulty. Cranial Nerves: II: Visual fields grossly normal III,IV, VI: ptosis not present, extra-ocular motions intact bilaterally V,VII: smile symmetric, facial light touch sensation normal bilaterally VIII: hearing normal bilaterally XI: bilateral shoulder shrug XII: midline tongue extension Motor: Able to lift all extremities against gravity with no focal weakness appreciated Sensory: Pinprick and light touch intact throughout, bilaterally   Lab Results: Basic Metabolic Panel: Recent Labs  Lab 03/21/24 1346 03/24/24 1506  NA 139 140  K 4.0 4.2  CL 107 105  CO2 24 24  GLUCOSE 97 103*  BUN 17 17  CREATININE 0.98 1.03  CALCIUM 8.9 8.9    Liver Function Tests: Recent Labs  Lab 03/21/24 1346 03/24/24 1506  AST 23 23  ALT 14 15  ALKPHOS 146* 152*  BILITOT 0.8 0.9  PROT 6.6 6.9  ALBUMIN 3.6 3.2*   No results for input(s): LIPASE, AMYLASE in the  last 168 hours. No results for input(s): AMMONIA in the last 168 hours.  CBC: Recent Labs  Lab 03/21/24 1346 03/24/24 1506  WBC 7.4 6.4  NEUTROABS  --  3.6  HGB 15.6 15.7  HCT 46.2 46.3  MCV 93.5 93.0  PLT 256 240    Cardiac Enzymes: No results for input(s): CKTOTAL, CKMB, CKMBINDEX, TROPONINI in the last 168 hours.  Lipid Panel: Recent Labs  Lab 03/24/24 1506  CHOL 151  TRIG 113  HDL 49  CHOLHDL 3.1  VLDL 23  LDLCALC 79    CBG: No results for input(s): GLUCAP in the last 168 hours.  Microbiology: Results for orders placed or performed in visit on 08/03/21  Microscopic Examination     Status: None   Collection Time: 08/03/21 10:14 AM   Urine  Result Value Ref Range Status   WBC, UA None seen 0 - 5 /hpf Final   RBC, Urine 0-2 0 - 2 /hpf Final   Epithelial Cells (non renal) None seen 0 - 10 /hpf Final   Bacteria, UA None seen None seen/Few Final    Coagulation Studies: Recent Labs    03/24/24 1506  LABPROT 13.9  INR 1.0    Imaging: US  Carotid Bilateral (at [facility] and [facility] only) Result Date: 03/25/2024 CLINICAL DATA:  Stroke EXAM: BILATERAL CAROTID DUPLEX ULTRASOUND TECHNIQUE: Elnor scale  imaging, color Doppler and duplex ultrasound were performed of bilateral carotid and vertebral arteries in the neck. COMPARISON:  CT angiogram of the head and neck performed March 24, 2024 FINDINGS: Criteria: Quantification of carotid stenosis is based on velocity parameters that correlate the residual internal carotid diameter with NASCET-based stenosis levels, using the diameter of the distal internal carotid lumen as the denominator for stenosis measurement. The following velocity measurements were obtained: RIGHT ICA: 88 cm/sec CCA: 64 cm/sec SYSTOLIC ICA/CCA RATIO:  1.4 ECA: 81 cm/sec LEFT ICA: 65 cm/sec CCA: 54 cm/sec SYSTOLIC ICA/CCA RATIO:  1.2 ECA: 105 cm/sec RIGHT CAROTID ARTERY: Patent. RIGHT VERTEBRAL ARTERY:  Antegrade. LEFT CAROTID ARTERY:  Patent. LEFT  VERTEBRAL ARTERY:  Antegrade. IMPRESSION: 1. Right carotid system: Within normal limits. No evidence of flow-limiting stenosis. 2. Left carotid system: Within normal limits. No evidence of flow-limiting stenosis. Electronically Signed   By: Maude Naegeli M.D.   On: 03/25/2024 07:33   CT ANGIO HEAD NECK W WO CM Addendum Date: 03/25/2024  ADDENDUM #1   EXAM: CT HEAD WITHOUT AND CTA HEAD AND NECK WITH AND WITHOUT 03/24/2024 08:49:53 PM TECHNIQUE: CTA of the head and neck was performed with and without the administration of 75 mL of iohexol  (OMNIPAQUE ) 350 MG/ML injection. Noncontrast CT of the head with reconstructed 2-D images are also provided for review. Multiplanar 2D and/or 3D reformatted images are provided for review. Automated exposure control, iterative reconstruction, and/or weight based adjustment of the mA/kV was utilized to reduce the radiation dose to as low as reasonably achievable. COMPARISON: Prior MRI from 03/21/2024. CLINICAL HISTORY: FINDINGS: CT HEAD: BRAIN AND VENTRICLES: Age related cerebral atrophy with mild chronic microvascular ischemic disease. Previously identified subcentimeter left cerebellar infarct not well visualized by CT. No other acute large vessel territory infarct. No acute intracranial hemorrhage. 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 No mass effect. No midline shift. No extra-axial fluid collection. No hydrocephalus. ORBITS: No acute abnormality. SINUSES AND MASTOIDS: No acute abnormality. CTA NECK: AORTIC ARCH AND ARCH VESSELS: No dissection or arterial injury. No significant stenosis of the brachiocephalic or subclavian arteries. CERVICAL CAROTID ARTERIES: Eccentric soft plaque at the left carotid bulb without hemodynamically significant greater than 50% stenosis. No dissection, arterial injury, or hemodynamically significant stenosis by NASCET criteria. CERVICAL VERTEBRAL ARTERIES: Right vertebral artery dominant and widely patent in the vertebral basilar  junction. Diminutive left vertebral artery patent as well. No dissection, arterial injury, or significant stenosis. LUNGS AND MEDIASTINUM: Unremarkable. SOFT TISSUES: No acute abnormality. BONES: Calcified atherosclerosis noted about the skull base. No acute abnormality. CTA HEAD: ANTERIOR CIRCULATION: Mild atherosclerotic change about the carotid siphons without hemodynamically significant stenosis. Right A1 dominant. Left A1 hypoplastic. No significant stenosis of the middle cerebral arteries. No aneurysm. POSTERIOR CIRCULATION: Both PICA patent. Basilar patent without stenosis. Superior cerebellar arteries patent bilaterally. Right PCA supplied via the basilar. Fetal type origin of the left PCA. No significant stenosis of the posterior cerebral arteries. No significant stenosis of the basilar artery. No significant stenosis of the vertebral arteries. No aneurysm. OTHER: No dural venous sinus thrombosis on this non-dedicated study. IMPRESSION: 1. Negative CTA for large vessel occlusion or other emergent finding. 2. Mild for age atheromatous disease about the left carotid bulb and carotid siphons without hemodynamically significant stenosis stenosis. 3. No other new acute intracranial abnormality. Previously identified left cerebellar infarct not well seen by CT. Electronically signed by: Morene Hoard MD 03/25/2024 02:05 AM EDT RP Workstation: [id number]   Result Date: 03/25/2024  ORIGINAL  REPORT   EXAM: CT HEAD WITHOUT CTA HEAD AND NECK WITH AND WITHOUT 03/24/2024 08:49:53 PM TECHNIQUE: CTA of the head and neck was performed with and without the administration of intravenous contrast. Noncontrast CT of the head with reconstructed 2-D images are also provided for review. Multiplanar 2D and/or 3D reformatted images are provided for review. Automated exposure control, iterative reconstruction, and/or weight based adjustment of the mA/kV was utilized to reduce the radiation dose to as low as reasonably  achievable. COMPARISON: None available CLINICAL HISTORY: Neuro deficit, acute, stroke suspected. Table formatting from the original note was not included. FINDINGS: BRAIN AND VENTRICLES: No acute intracranial hemorrhage. No mass effect. No midline shift. No extra-axial fluid collection. No evidence of acute infarct. No hydrocephalus. ORBITS: No acute abnormality. SINUSES AND MASTOIDS: No acute abnormality. AORTIC ARCH AND ARCH VESSELS: No dissection or arterial injury. No significant stenosis of the brachiocephalic or subclavian arteries. CERVICAL CAROTID ARTERIES: No dissection, arterial injury, or hemodynamically significant stenosis by NASCET criteria. CERVICAL VERTEBRAL ARTERIES: No dissection, arterial injury, or significant stenosis. LUNGS AND MEDIASTINUM: Unremarkable. SOFT TISSUES: No acute abnormality. BONES: No acute abnormality. ANTERIOR CIRCULATION: No significant stenosis of the internal carotid arteries. No significant stenosis of the anterior cerebral arteries. No significant stenosis of the middle cerebral arteries. No aneurysm. POSTERIOR CIRCULATION: No significant stenosis of the posterior cerebral arteries. No significant stenosis of the basilar artery. No significant stenosis of the vertebral arteries. No aneurysm. OTHER: No dural venous sinus thrombosis on this non-dedicated study. IMPRESSION: 1. No large vessel occlusion in the head or neck. Electronically signed by: Morene Hoard MD 03/25/2024 01:46 AM EDT RP Workstation: [id number]    Medications: Scheduled:  atorvastatin  40 mg Oral Daily   clopidogrel  75 mg Oral Daily   enoxaparin (LOVENOX) injection  40 mg Subcutaneous Q24H   finasteride   5 mg Oral Daily   levothyroxine  75 mcg Oral Q0600   linaclotide  72 mcg Oral q morning    Assessment/Plan: 83 y.o. male with a history of HLD and heart murmur who presents after having had an abnormal MRI.  Patient was obtaining imaging for f/u of an arachnoid cyst.  Acute cerebellar  infarct noted for imaging and patient referred for admission.  Patient is asymptomatic.   MRI personally reviewed and reveals a small acute left cerebellar infarct.  Concern is for an embolic etiology.   LDL 79, A1c 5.0.  Carotid doppler testing unremarkable.  CTA of the head and neck personally reviewed and shows no evidence of hemodynamically significant stenosis.   Recommendations BP goal <140/80 Would change lipid lowering agent to a high intensity statin.  LDL goal <70 Echocardiogram results pending.  If results not concerning patient may be discharged and followed up on an outpatient basis Continue Plavix 75mg  daily.  Recently instituted Telemetry.  If unremarkable while hospitalized would consider long term monitoring on an outpatient basis.     LOS: 0 days   Sonny Hock, MD Neurology  03/25/2024  9:40 AM

## 2024-03-28 DIAGNOSIS — M3501 Sicca syndrome with keratoconjunctivitis: Secondary | ICD-10-CM | POA: Diagnosis not present

## 2024-03-28 DIAGNOSIS — H1820 Unspecified corneal edema: Secondary | ICD-10-CM | POA: Diagnosis not present

## 2024-03-28 DIAGNOSIS — H401123 Primary open-angle glaucoma, left eye, severe stage: Secondary | ICD-10-CM | POA: Diagnosis not present

## 2024-03-28 DIAGNOSIS — H401112 Primary open-angle glaucoma, right eye, moderate stage: Secondary | ICD-10-CM | POA: Diagnosis not present

## 2024-04-03 DIAGNOSIS — E785 Hyperlipidemia, unspecified: Secondary | ICD-10-CM | POA: Diagnosis not present

## 2024-04-03 DIAGNOSIS — R42 Dizziness and giddiness: Secondary | ICD-10-CM | POA: Diagnosis not present

## 2024-04-03 DIAGNOSIS — R197 Diarrhea, unspecified: Secondary | ICD-10-CM | POA: Diagnosis not present

## 2024-04-03 DIAGNOSIS — K5900 Constipation, unspecified: Secondary | ICD-10-CM | POA: Diagnosis not present

## 2024-04-03 DIAGNOSIS — I10 Essential (primary) hypertension: Secondary | ICD-10-CM | POA: Diagnosis not present

## 2024-04-03 DIAGNOSIS — I63542 Cerebral infarction due to unspecified occlusion or stenosis of left cerebellar artery: Secondary | ICD-10-CM | POA: Diagnosis not present

## 2024-04-03 DIAGNOSIS — E039 Hypothyroidism, unspecified: Secondary | ICD-10-CM | POA: Diagnosis not present

## 2024-04-30 DIAGNOSIS — I639 Cerebral infarction, unspecified: Secondary | ICD-10-CM | POA: Diagnosis not present

## 2025-02-26 ENCOUNTER — Ambulatory Visit: Admitting: Physician Assistant
